# Patient Record
Sex: Female | Born: 1940 | Race: White | Hispanic: No | Smoking: Former smoker
Health system: Southern US, Community
[De-identification: ages and names within clinical notes are randomized; demographics above are authoritative.]

## PROBLEM LIST (undated history)

## (undated) DIAGNOSIS — M797 Fibromyalgia: Secondary | ICD-10-CM

## (undated) DIAGNOSIS — F32A Depression, unspecified: Secondary | ICD-10-CM

## (undated) DIAGNOSIS — M109 Gout, unspecified: Secondary | ICD-10-CM

## (undated) DIAGNOSIS — F419 Anxiety disorder, unspecified: Secondary | ICD-10-CM

## (undated) DIAGNOSIS — C3490 Malignant neoplasm of unspecified part of unspecified bronchus or lung: Secondary | ICD-10-CM

## (undated) DIAGNOSIS — D649 Anemia, unspecified: Secondary | ICD-10-CM

## (undated) DIAGNOSIS — J45909 Unspecified asthma, uncomplicated: Secondary | ICD-10-CM

## (undated) DIAGNOSIS — M1990 Unspecified osteoarthritis, unspecified site: Secondary | ICD-10-CM

## (undated) DIAGNOSIS — K219 Gastro-esophageal reflux disease without esophagitis: Secondary | ICD-10-CM

## (undated) DIAGNOSIS — J449 Chronic obstructive pulmonary disease, unspecified: Secondary | ICD-10-CM

## (undated) HISTORY — DX: Fibromyalgia: M79.7

## (undated) HISTORY — PX: FOOT SURGERY: SHX648

## (undated) HISTORY — DX: Malignant neoplasm of unspecified part of unspecified bronchus or lung: C34.90

## (undated) HISTORY — DX: Anxiety disorder, unspecified: F41.9

## (undated) HISTORY — DX: Gastro-esophageal reflux disease without esophagitis: K21.9

## (undated) HISTORY — PX: OTHER SURGICAL HISTORY: SHX169

## (undated) HISTORY — DX: Gout, unspecified: M10.9

## (undated) HISTORY — DX: Anemia, unspecified: D64.9

## (undated) HISTORY — PX: CATARACT EXTRACTION: SUR2

## (undated) HISTORY — DX: Unspecified osteoarthritis, unspecified site: M19.90

## (undated) HISTORY — DX: Depression, unspecified: F32.A

## (undated) HISTORY — DX: Unspecified asthma, uncomplicated: J45.909

## (undated) HISTORY — DX: Chronic obstructive pulmonary disease, unspecified: J44.9

---

## 2004-10-01 ENCOUNTER — Encounter (INDEPENDENT_AMBULATORY_CARE_PROVIDER_SITE_OTHER): Payer: Self-pay | Admitting: *Deleted

## 2004-10-01 LAB — CONVERTED CEMR LAB

## 2004-10-14 ENCOUNTER — Ambulatory Visit: Payer: Self-pay | Admitting: Family Medicine

## 2004-10-22 ENCOUNTER — Ambulatory Visit (HOSPITAL_COMMUNITY): Admission: RE | Admit: 2004-10-22 | Discharge: 2004-10-22 | Payer: Self-pay | Admitting: Family Medicine

## 2004-10-29 ENCOUNTER — Ambulatory Visit: Payer: Self-pay | Admitting: Family Medicine

## 2004-11-11 ENCOUNTER — Ambulatory Visit: Payer: Self-pay | Admitting: Family Medicine

## 2004-11-23 ENCOUNTER — Ambulatory Visit: Payer: Self-pay | Admitting: Family Medicine

## 2004-12-01 ENCOUNTER — Ambulatory Visit (HOSPITAL_COMMUNITY): Admission: RE | Admit: 2004-12-01 | Discharge: 2004-12-01 | Payer: Self-pay | Admitting: *Deleted

## 2004-12-09 ENCOUNTER — Encounter: Admission: RE | Admit: 2004-12-09 | Discharge: 2004-12-09 | Payer: Self-pay | Admitting: Sports Medicine

## 2004-12-13 ENCOUNTER — Ambulatory Visit: Payer: Self-pay | Admitting: Sports Medicine

## 2004-12-21 ENCOUNTER — Encounter: Admission: RE | Admit: 2004-12-21 | Discharge: 2004-12-21 | Payer: Self-pay | Admitting: Sports Medicine

## 2004-12-21 ENCOUNTER — Ambulatory Visit: Payer: Self-pay | Admitting: Family Medicine

## 2004-12-22 ENCOUNTER — Ambulatory Visit: Payer: Self-pay | Admitting: Family Medicine

## 2004-12-29 ENCOUNTER — Ambulatory Visit: Payer: Self-pay | Admitting: Family Medicine

## 2004-12-30 ENCOUNTER — Ambulatory Visit (HOSPITAL_COMMUNITY): Admission: RE | Admit: 2004-12-30 | Discharge: 2004-12-30 | Payer: Self-pay | Admitting: Sports Medicine

## 2005-01-17 ENCOUNTER — Ambulatory Visit: Payer: Self-pay | Admitting: Sports Medicine

## 2005-01-28 ENCOUNTER — Ambulatory Visit: Payer: Self-pay | Admitting: Sports Medicine

## 2005-02-01 ENCOUNTER — Ambulatory Visit: Payer: Self-pay | Admitting: Family Medicine

## 2005-02-17 ENCOUNTER — Ambulatory Visit (HOSPITAL_COMMUNITY): Admission: RE | Admit: 2005-02-17 | Discharge: 2005-02-17 | Payer: Self-pay | Admitting: Family Medicine

## 2005-02-22 ENCOUNTER — Ambulatory Visit: Payer: Self-pay | Admitting: Family Medicine

## 2005-04-12 ENCOUNTER — Ambulatory Visit: Payer: Self-pay | Admitting: Family Medicine

## 2005-09-02 ENCOUNTER — Ambulatory Visit: Payer: Self-pay | Admitting: Family Medicine

## 2006-01-08 IMAGING — CR DG CERVICAL SPINE COMPLETE 4+V
7 series · 7 of 7 positions shown · non-contrast
Comparison: none

CLINICAL DATA: Pain. 
 CERVICAL SPINE ? 5 VIEWS:
 Five views of the cervical spine were obtained.  There is degenerative disc disease particularly at C5-6 with loss of disc space and spur formation.  On oblique views, there is disc foraminal narrowing of a moderate degree bilaterally at C5-6 with the remainder of the foramina being patent. The odontoid process is intact.

[w c-spine lat]
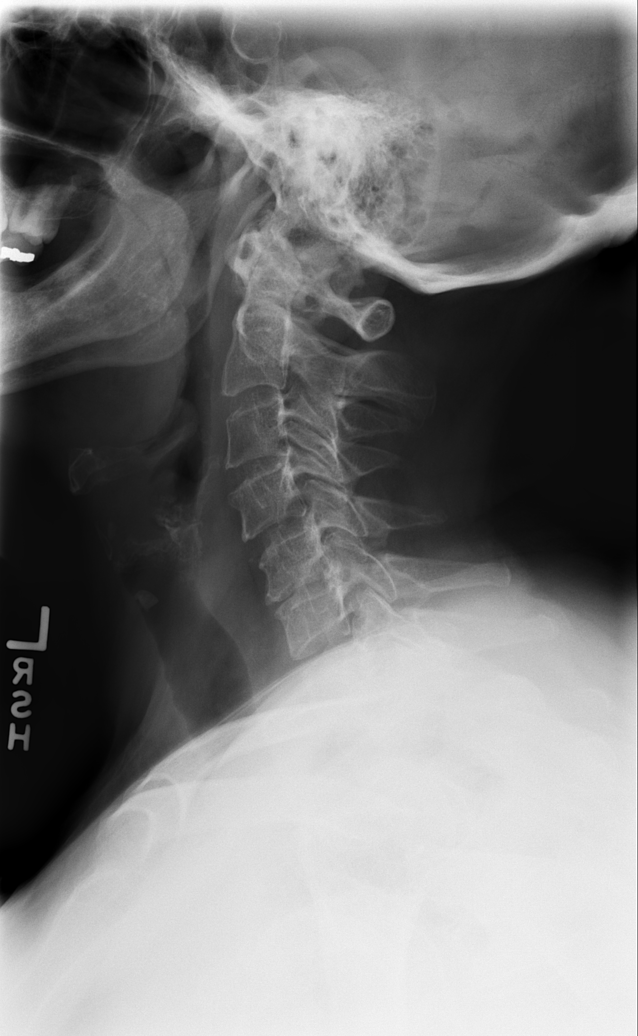

[w c-spine oblique (1 of 2)]
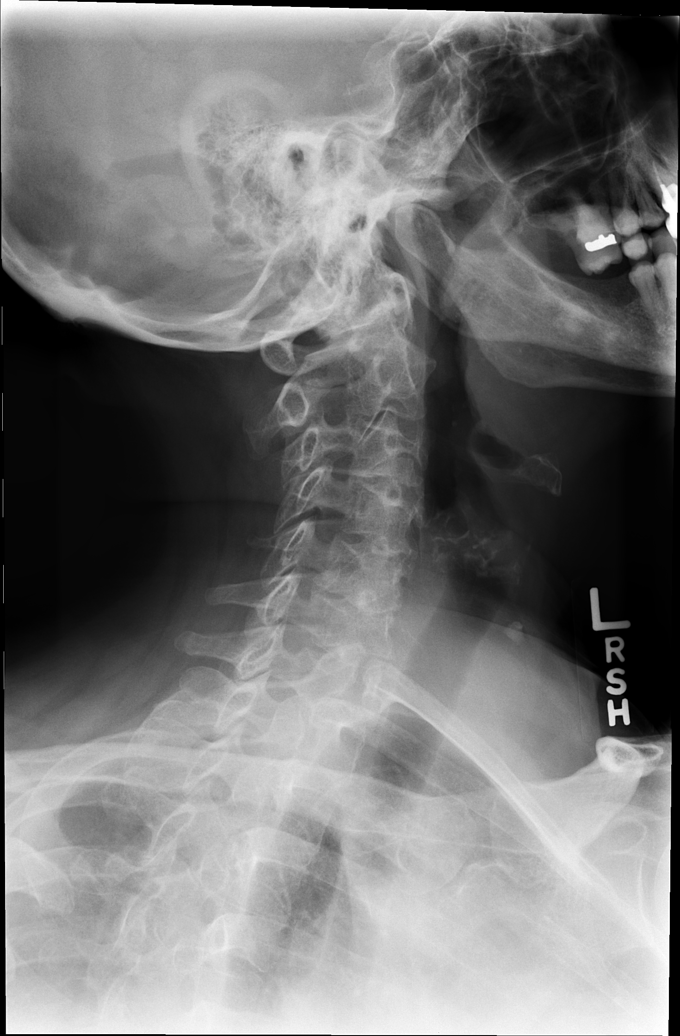

[w c-spine oblique (2 of 2)]
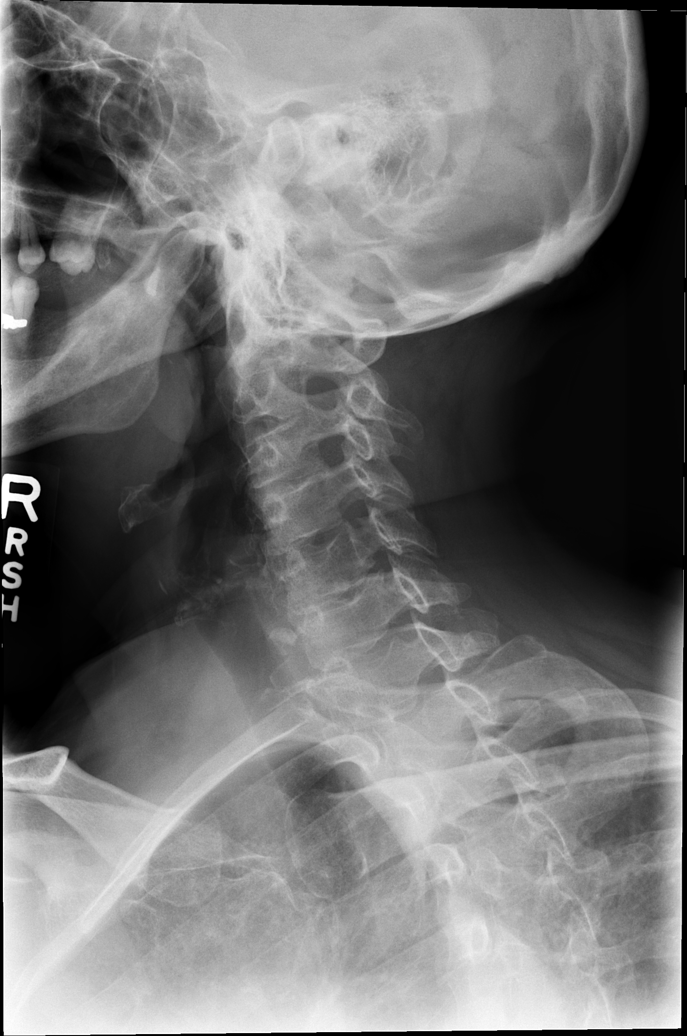

[w c-spine a.p. *]
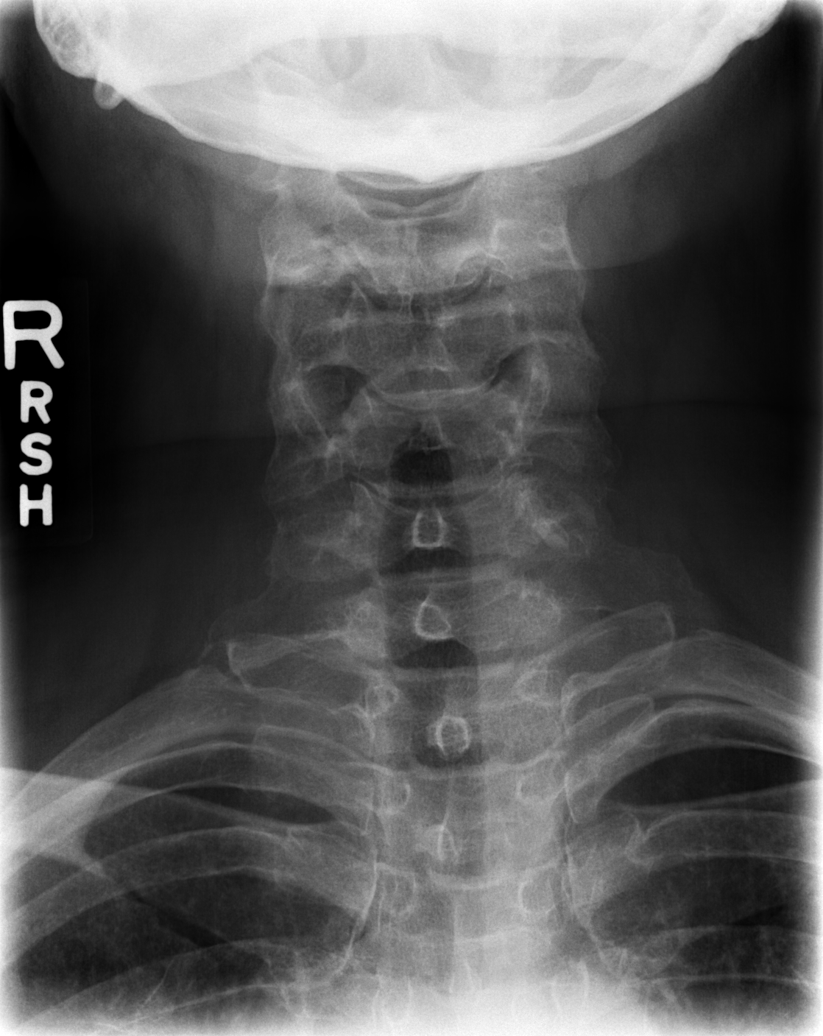

[w c-spine odontoid * (1 of 2)]
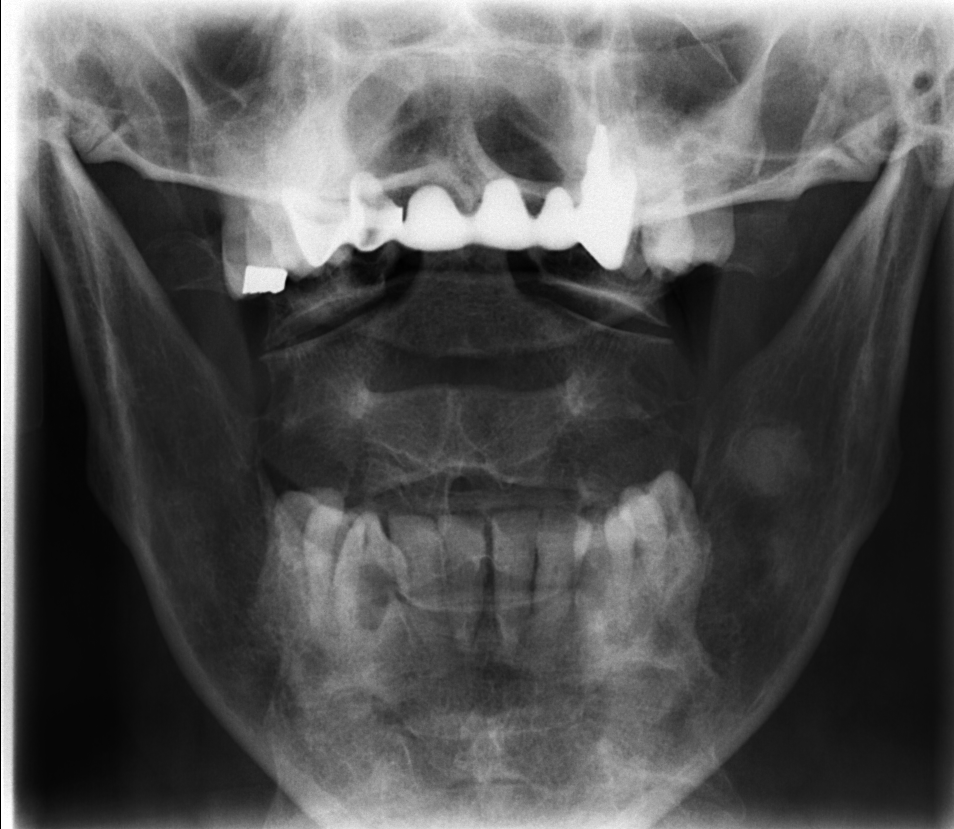

[w c-spine odontoid * (2 of 2)]
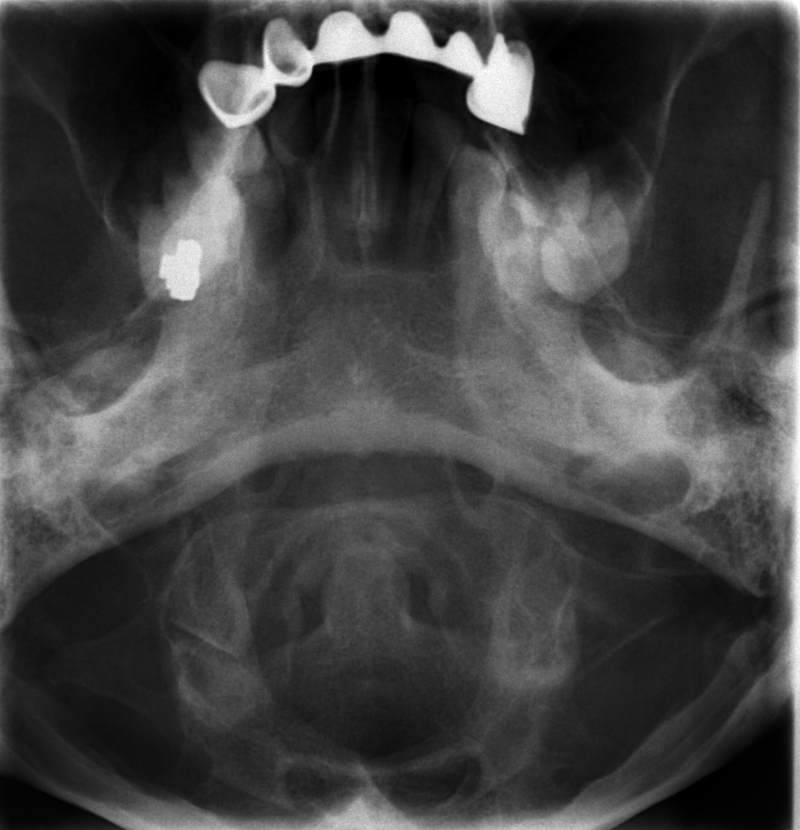

[w swimmers view *]
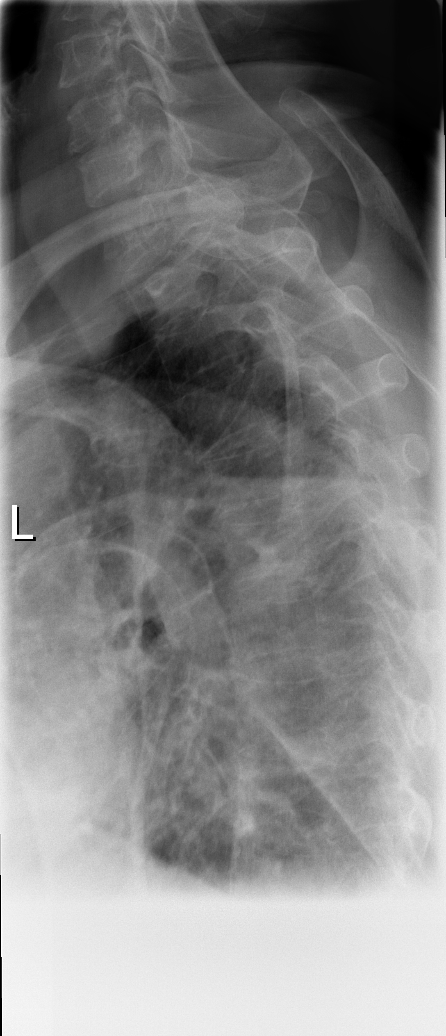

[7 of 7 positions shown; findings below may reference images not displayed]

IMPRESSION: Degenerative disc disease at C5-6 with bilateral foraminal narrowing at that level.  Normal alignment.

## 2006-01-08 IMAGING — CR DG CERVICAL SPINE FLEX&EXT ONLY
2 series · 2 of 2 positions shown · non-contrast
Comparison: none

CLINICAL DATA: Neck and left arm pain, left shoulder pain.   Reported history of arthritis.  No reported injury.  
CERVICAL SPINE FLEXION AND EXTENSION:
Satisfactory range of motion with extension but some decrease in ROM with flexion.  Straightening of the cervical spine mainly the lower aspect. Anterior subluxation, C4 on C5 by 2.5-3.0 mm.    Minimal anterior subluxation of C4 on C5 was also appreciated on the complete C-spine series earlier today.  The minimal subluxation at C5-6 is likely  associated with spondylosis in the absence of trauma.  egenerative changes involving the facet and uncovertebral joints.

[w c-spine extension]
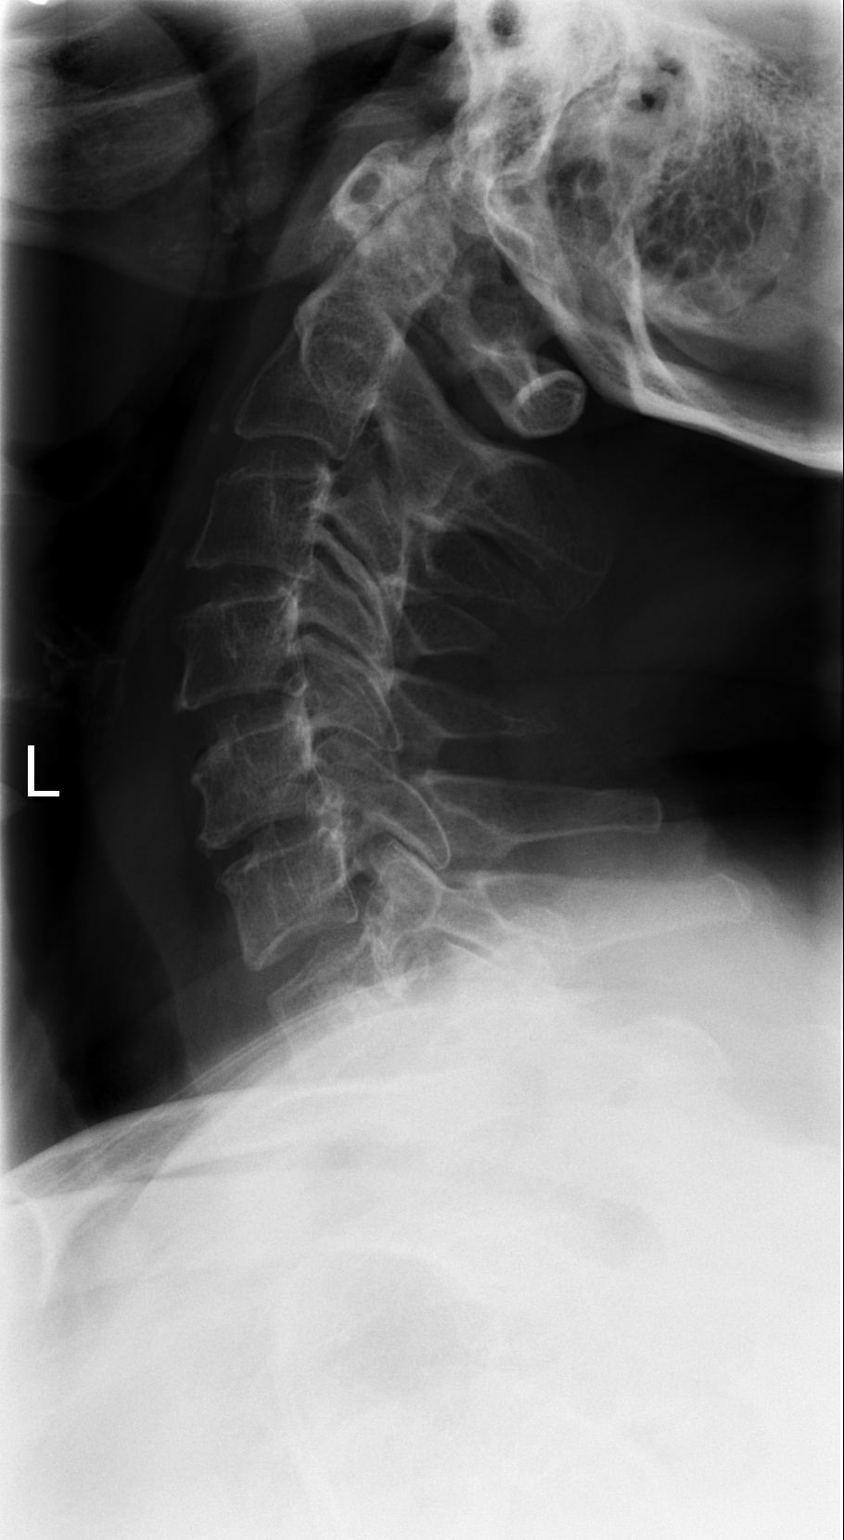

[w c-spine flexion *]
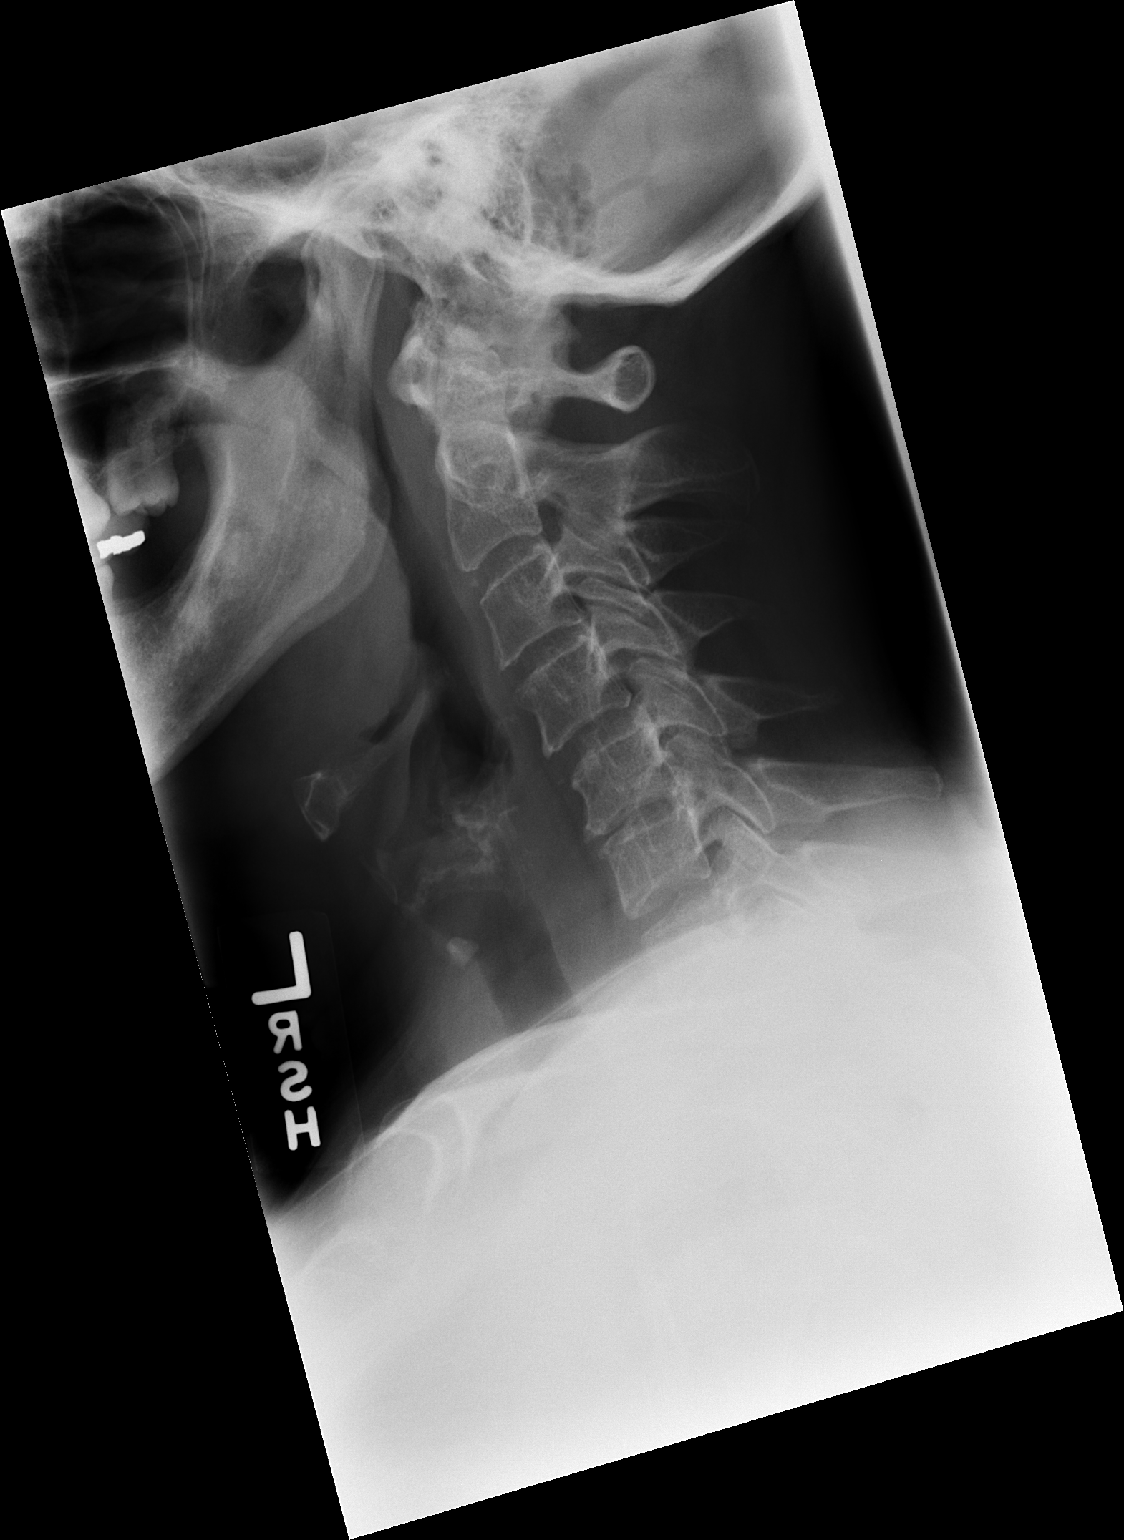

[2 of 2 positions shown; findings below may reference images not displayed]

IMPRESSION: Mild anterior subluxation of C4 on C5 which accentuates minimally with flexion.  Reportedly no history of trauma. The findings are likely degenerative in nature, possibly laxity of ligamentous structures.

## 2006-11-23 DIAGNOSIS — G4700 Insomnia, unspecified: Secondary | ICD-10-CM | POA: Insufficient documentation

## 2006-11-23 DIAGNOSIS — M129 Arthropathy, unspecified: Secondary | ICD-10-CM | POA: Insufficient documentation

## 2006-11-23 DIAGNOSIS — F329 Major depressive disorder, single episode, unspecified: Secondary | ICD-10-CM | POA: Insufficient documentation

## 2006-11-23 DIAGNOSIS — J45909 Unspecified asthma, uncomplicated: Secondary | ICD-10-CM | POA: Insufficient documentation

## 2006-11-23 DIAGNOSIS — F411 Generalized anxiety disorder: Secondary | ICD-10-CM | POA: Insufficient documentation

## 2006-11-23 DIAGNOSIS — I10 Essential (primary) hypertension: Secondary | ICD-10-CM | POA: Insufficient documentation

## 2006-11-23 DIAGNOSIS — E669 Obesity, unspecified: Secondary | ICD-10-CM | POA: Insufficient documentation

## 2006-11-23 DIAGNOSIS — F17200 Nicotine dependence, unspecified, uncomplicated: Secondary | ICD-10-CM | POA: Insufficient documentation

## 2006-11-23 DIAGNOSIS — H269 Unspecified cataract: Secondary | ICD-10-CM | POA: Insufficient documentation

## 2006-11-23 DIAGNOSIS — I871 Compression of vein: Secondary | ICD-10-CM | POA: Insufficient documentation

## 2006-11-24 ENCOUNTER — Encounter (INDEPENDENT_AMBULATORY_CARE_PROVIDER_SITE_OTHER): Payer: Self-pay | Admitting: *Deleted

## 2018-07-18 DIAGNOSIS — M549 Dorsalgia, unspecified: Secondary | ICD-10-CM | POA: Diagnosis not present

## 2018-07-18 DIAGNOSIS — Z720 Tobacco use: Secondary | ICD-10-CM

## 2018-07-18 DIAGNOSIS — K219 Gastro-esophageal reflux disease without esophagitis: Secondary | ICD-10-CM | POA: Diagnosis not present

## 2018-07-18 DIAGNOSIS — J9621 Acute and chronic respiratory failure with hypoxia: Secondary | ICD-10-CM | POA: Diagnosis not present

## 2018-07-18 DIAGNOSIS — J441 Chronic obstructive pulmonary disease with (acute) exacerbation: Secondary | ICD-10-CM | POA: Diagnosis not present

## 2018-07-18 DIAGNOSIS — J9620 Acute and chronic respiratory failure, unspecified whether with hypoxia or hypercapnia: Secondary | ICD-10-CM

## 2018-07-19 DIAGNOSIS — J9621 Acute and chronic respiratory failure with hypoxia: Secondary | ICD-10-CM | POA: Diagnosis not present

## 2018-07-19 DIAGNOSIS — J441 Chronic obstructive pulmonary disease with (acute) exacerbation: Secondary | ICD-10-CM | POA: Diagnosis not present

## 2018-07-19 DIAGNOSIS — F419 Anxiety disorder, unspecified: Secondary | ICD-10-CM | POA: Diagnosis not present

## 2018-07-19 DIAGNOSIS — I10 Essential (primary) hypertension: Secondary | ICD-10-CM | POA: Diagnosis not present

## 2018-07-20 DIAGNOSIS — J9621 Acute and chronic respiratory failure with hypoxia: Secondary | ICD-10-CM | POA: Diagnosis not present

## 2018-07-20 DIAGNOSIS — I10 Essential (primary) hypertension: Secondary | ICD-10-CM | POA: Diagnosis not present

## 2018-07-20 DIAGNOSIS — J441 Chronic obstructive pulmonary disease with (acute) exacerbation: Secondary | ICD-10-CM | POA: Diagnosis not present

## 2018-07-20 DIAGNOSIS — F419 Anxiety disorder, unspecified: Secondary | ICD-10-CM | POA: Diagnosis not present

## 2018-07-21 DIAGNOSIS — Z9114 Patient's other noncompliance with medication regimen: Secondary | ICD-10-CM

## 2018-07-21 DIAGNOSIS — M549 Dorsalgia, unspecified: Secondary | ICD-10-CM | POA: Diagnosis not present

## 2018-07-21 DIAGNOSIS — R5381 Other malaise: Secondary | ICD-10-CM

## 2018-07-21 DIAGNOSIS — G4733 Obstructive sleep apnea (adult) (pediatric): Secondary | ICD-10-CM

## 2018-07-21 DIAGNOSIS — J441 Chronic obstructive pulmonary disease with (acute) exacerbation: Secondary | ICD-10-CM | POA: Diagnosis not present

## 2018-07-21 DIAGNOSIS — K219 Gastro-esophageal reflux disease without esophagitis: Secondary | ICD-10-CM | POA: Diagnosis not present

## 2018-07-21 DIAGNOSIS — J9621 Acute and chronic respiratory failure with hypoxia: Secondary | ICD-10-CM | POA: Diagnosis not present

## 2019-06-15 DIAGNOSIS — E872 Acidosis: Secondary | ICD-10-CM

## 2019-06-15 DIAGNOSIS — J189 Pneumonia, unspecified organism: Secondary | ICD-10-CM | POA: Diagnosis not present

## 2019-06-15 DIAGNOSIS — I361 Nonrheumatic tricuspid (valve) insufficiency: Secondary | ICD-10-CM | POA: Diagnosis not present

## 2019-06-15 DIAGNOSIS — J441 Chronic obstructive pulmonary disease with (acute) exacerbation: Secondary | ICD-10-CM

## 2019-06-15 DIAGNOSIS — I5032 Chronic diastolic (congestive) heart failure: Secondary | ICD-10-CM | POA: Diagnosis not present

## 2019-06-15 DIAGNOSIS — I272 Pulmonary hypertension, unspecified: Secondary | ICD-10-CM

## 2019-06-15 DIAGNOSIS — E871 Hypo-osmolality and hyponatremia: Secondary | ICD-10-CM

## 2019-06-15 DIAGNOSIS — I130 Hypertensive heart and chronic kidney disease with heart failure and stage 1 through stage 4 chronic kidney disease, or unspecified chronic kidney disease: Secondary | ICD-10-CM | POA: Diagnosis not present

## 2019-06-15 DIAGNOSIS — J440 Chronic obstructive pulmonary disease with acute lower respiratory infection: Secondary | ICD-10-CM | POA: Diagnosis not present

## 2019-06-16 DIAGNOSIS — I130 Hypertensive heart and chronic kidney disease with heart failure and stage 1 through stage 4 chronic kidney disease, or unspecified chronic kidney disease: Secondary | ICD-10-CM | POA: Diagnosis not present

## 2019-06-16 DIAGNOSIS — J189 Pneumonia, unspecified organism: Secondary | ICD-10-CM | POA: Diagnosis not present

## 2019-06-16 DIAGNOSIS — I5032 Chronic diastolic (congestive) heart failure: Secondary | ICD-10-CM | POA: Diagnosis not present

## 2019-06-16 DIAGNOSIS — J440 Chronic obstructive pulmonary disease with acute lower respiratory infection: Secondary | ICD-10-CM | POA: Diagnosis not present

## 2019-06-17 DIAGNOSIS — J189 Pneumonia, unspecified organism: Secondary | ICD-10-CM | POA: Diagnosis not present

## 2019-06-17 DIAGNOSIS — I130 Hypertensive heart and chronic kidney disease with heart failure and stage 1 through stage 4 chronic kidney disease, or unspecified chronic kidney disease: Secondary | ICD-10-CM | POA: Diagnosis not present

## 2019-06-17 DIAGNOSIS — J440 Chronic obstructive pulmonary disease with acute lower respiratory infection: Secondary | ICD-10-CM | POA: Diagnosis not present

## 2019-06-17 DIAGNOSIS — I5032 Chronic diastolic (congestive) heart failure: Secondary | ICD-10-CM | POA: Diagnosis not present

## 2019-06-18 DIAGNOSIS — I130 Hypertensive heart and chronic kidney disease with heart failure and stage 1 through stage 4 chronic kidney disease, or unspecified chronic kidney disease: Secondary | ICD-10-CM | POA: Diagnosis not present

## 2019-06-18 DIAGNOSIS — I5032 Chronic diastolic (congestive) heart failure: Secondary | ICD-10-CM | POA: Diagnosis not present

## 2019-06-18 DIAGNOSIS — J440 Chronic obstructive pulmonary disease with acute lower respiratory infection: Secondary | ICD-10-CM | POA: Diagnosis not present

## 2019-06-18 DIAGNOSIS — J189 Pneumonia, unspecified organism: Secondary | ICD-10-CM | POA: Diagnosis not present

## 2019-06-19 DIAGNOSIS — I5032 Chronic diastolic (congestive) heart failure: Secondary | ICD-10-CM | POA: Diagnosis not present

## 2019-06-19 DIAGNOSIS — J189 Pneumonia, unspecified organism: Secondary | ICD-10-CM | POA: Diagnosis not present

## 2019-06-19 DIAGNOSIS — J440 Chronic obstructive pulmonary disease with acute lower respiratory infection: Secondary | ICD-10-CM | POA: Diagnosis not present

## 2019-06-19 DIAGNOSIS — I130 Hypertensive heart and chronic kidney disease with heart failure and stage 1 through stage 4 chronic kidney disease, or unspecified chronic kidney disease: Secondary | ICD-10-CM | POA: Diagnosis not present

## 2019-11-05 DIAGNOSIS — N179 Acute kidney failure, unspecified: Secondary | ICD-10-CM | POA: Diagnosis not present

## 2019-11-05 DIAGNOSIS — L03115 Cellulitis of right lower limb: Secondary | ICD-10-CM | POA: Diagnosis not present

## 2019-11-05 DIAGNOSIS — R52 Pain, unspecified: Secondary | ICD-10-CM | POA: Diagnosis not present

## 2019-11-05 DIAGNOSIS — S82121A Displaced fracture of lateral condyle of right tibia, initial encounter for closed fracture: Secondary | ICD-10-CM | POA: Diagnosis not present

## 2019-11-05 DIAGNOSIS — J449 Chronic obstructive pulmonary disease, unspecified: Secondary | ICD-10-CM

## 2019-11-06 DIAGNOSIS — S82121A Displaced fracture of lateral condyle of right tibia, initial encounter for closed fracture: Secondary | ICD-10-CM | POA: Diagnosis not present

## 2019-11-06 DIAGNOSIS — R52 Pain, unspecified: Secondary | ICD-10-CM | POA: Diagnosis not present

## 2019-11-06 DIAGNOSIS — N179 Acute kidney failure, unspecified: Secondary | ICD-10-CM | POA: Diagnosis not present

## 2019-11-06 DIAGNOSIS — L03115 Cellulitis of right lower limb: Secondary | ICD-10-CM | POA: Diagnosis not present

## 2019-11-07 DIAGNOSIS — S82121A Displaced fracture of lateral condyle of right tibia, initial encounter for closed fracture: Secondary | ICD-10-CM | POA: Diagnosis not present

## 2019-11-07 DIAGNOSIS — R52 Pain, unspecified: Secondary | ICD-10-CM | POA: Diagnosis not present

## 2019-11-07 DIAGNOSIS — N179 Acute kidney failure, unspecified: Secondary | ICD-10-CM | POA: Diagnosis not present

## 2019-11-07 DIAGNOSIS — L03115 Cellulitis of right lower limb: Secondary | ICD-10-CM | POA: Diagnosis not present

## 2019-11-08 DIAGNOSIS — S82121A Displaced fracture of lateral condyle of right tibia, initial encounter for closed fracture: Secondary | ICD-10-CM | POA: Diagnosis not present

## 2019-11-08 DIAGNOSIS — L03115 Cellulitis of right lower limb: Secondary | ICD-10-CM | POA: Diagnosis not present

## 2019-11-08 DIAGNOSIS — N179 Acute kidney failure, unspecified: Secondary | ICD-10-CM | POA: Diagnosis not present

## 2019-11-08 DIAGNOSIS — R52 Pain, unspecified: Secondary | ICD-10-CM | POA: Diagnosis not present

## 2020-01-10 DIAGNOSIS — J449 Chronic obstructive pulmonary disease, unspecified: Secondary | ICD-10-CM | POA: Diagnosis not present

## 2020-01-10 DIAGNOSIS — J189 Pneumonia, unspecified organism: Secondary | ICD-10-CM | POA: Diagnosis not present

## 2020-01-10 DIAGNOSIS — R410 Disorientation, unspecified: Secondary | ICD-10-CM | POA: Diagnosis not present

## 2020-01-10 DIAGNOSIS — R197 Diarrhea, unspecified: Secondary | ICD-10-CM | POA: Diagnosis not present

## 2020-01-11 DIAGNOSIS — J449 Chronic obstructive pulmonary disease, unspecified: Secondary | ICD-10-CM | POA: Diagnosis not present

## 2020-01-11 DIAGNOSIS — R410 Disorientation, unspecified: Secondary | ICD-10-CM | POA: Diagnosis not present

## 2020-01-11 DIAGNOSIS — R197 Diarrhea, unspecified: Secondary | ICD-10-CM | POA: Diagnosis not present

## 2020-01-11 DIAGNOSIS — J189 Pneumonia, unspecified organism: Secondary | ICD-10-CM | POA: Diagnosis not present

## 2020-01-12 DIAGNOSIS — R197 Diarrhea, unspecified: Secondary | ICD-10-CM | POA: Diagnosis not present

## 2020-01-12 DIAGNOSIS — R410 Disorientation, unspecified: Secondary | ICD-10-CM | POA: Diagnosis not present

## 2020-01-12 DIAGNOSIS — J189 Pneumonia, unspecified organism: Secondary | ICD-10-CM | POA: Diagnosis not present

## 2020-01-12 DIAGNOSIS — J449 Chronic obstructive pulmonary disease, unspecified: Secondary | ICD-10-CM | POA: Diagnosis not present

## 2020-01-13 DIAGNOSIS — J449 Chronic obstructive pulmonary disease, unspecified: Secondary | ICD-10-CM | POA: Diagnosis not present

## 2020-01-13 DIAGNOSIS — J189 Pneumonia, unspecified organism: Secondary | ICD-10-CM | POA: Diagnosis not present

## 2020-01-13 DIAGNOSIS — R197 Diarrhea, unspecified: Secondary | ICD-10-CM | POA: Diagnosis not present

## 2020-01-13 DIAGNOSIS — R410 Disorientation, unspecified: Secondary | ICD-10-CM | POA: Diagnosis not present

## 2020-01-14 DIAGNOSIS — R410 Disorientation, unspecified: Secondary | ICD-10-CM | POA: Diagnosis not present

## 2020-01-14 DIAGNOSIS — J449 Chronic obstructive pulmonary disease, unspecified: Secondary | ICD-10-CM | POA: Diagnosis not present

## 2020-01-14 DIAGNOSIS — J189 Pneumonia, unspecified organism: Secondary | ICD-10-CM | POA: Diagnosis not present

## 2020-01-14 DIAGNOSIS — R197 Diarrhea, unspecified: Secondary | ICD-10-CM | POA: Diagnosis not present

## 2020-01-15 DIAGNOSIS — R410 Disorientation, unspecified: Secondary | ICD-10-CM | POA: Diagnosis not present

## 2020-01-15 DIAGNOSIS — R197 Diarrhea, unspecified: Secondary | ICD-10-CM | POA: Diagnosis not present

## 2020-01-15 DIAGNOSIS — J189 Pneumonia, unspecified organism: Secondary | ICD-10-CM | POA: Diagnosis not present

## 2020-01-15 DIAGNOSIS — J449 Chronic obstructive pulmonary disease, unspecified: Secondary | ICD-10-CM | POA: Diagnosis not present

## 2020-01-16 DIAGNOSIS — R410 Disorientation, unspecified: Secondary | ICD-10-CM | POA: Diagnosis not present

## 2020-01-16 DIAGNOSIS — J189 Pneumonia, unspecified organism: Secondary | ICD-10-CM | POA: Diagnosis not present

## 2020-01-16 DIAGNOSIS — J449 Chronic obstructive pulmonary disease, unspecified: Secondary | ICD-10-CM | POA: Diagnosis not present

## 2020-01-16 DIAGNOSIS — R197 Diarrhea, unspecified: Secondary | ICD-10-CM | POA: Diagnosis not present

## 2020-01-17 DIAGNOSIS — J189 Pneumonia, unspecified organism: Secondary | ICD-10-CM | POA: Diagnosis not present

## 2020-01-17 DIAGNOSIS — J449 Chronic obstructive pulmonary disease, unspecified: Secondary | ICD-10-CM | POA: Diagnosis not present

## 2020-01-17 DIAGNOSIS — R197 Diarrhea, unspecified: Secondary | ICD-10-CM | POA: Diagnosis not present

## 2020-01-17 DIAGNOSIS — R410 Disorientation, unspecified: Secondary | ICD-10-CM | POA: Diagnosis not present

## 2020-01-18 DIAGNOSIS — J189 Pneumonia, unspecified organism: Secondary | ICD-10-CM | POA: Diagnosis not present

## 2020-01-18 DIAGNOSIS — J449 Chronic obstructive pulmonary disease, unspecified: Secondary | ICD-10-CM | POA: Diagnosis not present

## 2020-01-18 DIAGNOSIS — R410 Disorientation, unspecified: Secondary | ICD-10-CM | POA: Diagnosis not present

## 2020-01-18 DIAGNOSIS — R197 Diarrhea, unspecified: Secondary | ICD-10-CM | POA: Diagnosis not present

## 2020-01-19 DIAGNOSIS — J189 Pneumonia, unspecified organism: Secondary | ICD-10-CM | POA: Diagnosis not present

## 2020-01-19 DIAGNOSIS — R410 Disorientation, unspecified: Secondary | ICD-10-CM | POA: Diagnosis not present

## 2020-01-19 DIAGNOSIS — R197 Diarrhea, unspecified: Secondary | ICD-10-CM | POA: Diagnosis not present

## 2020-01-19 DIAGNOSIS — J449 Chronic obstructive pulmonary disease, unspecified: Secondary | ICD-10-CM | POA: Diagnosis not present

## 2020-01-20 DIAGNOSIS — J189 Pneumonia, unspecified organism: Secondary | ICD-10-CM | POA: Diagnosis not present

## 2020-01-20 DIAGNOSIS — R410 Disorientation, unspecified: Secondary | ICD-10-CM | POA: Diagnosis not present

## 2020-01-20 DIAGNOSIS — R197 Diarrhea, unspecified: Secondary | ICD-10-CM | POA: Diagnosis not present

## 2020-01-20 DIAGNOSIS — J449 Chronic obstructive pulmonary disease, unspecified: Secondary | ICD-10-CM | POA: Diagnosis not present

## 2020-01-21 DIAGNOSIS — J189 Pneumonia, unspecified organism: Secondary | ICD-10-CM | POA: Diagnosis not present

## 2020-01-21 DIAGNOSIS — R197 Diarrhea, unspecified: Secondary | ICD-10-CM | POA: Diagnosis not present

## 2020-01-21 DIAGNOSIS — J449 Chronic obstructive pulmonary disease, unspecified: Secondary | ICD-10-CM | POA: Diagnosis not present

## 2020-01-21 DIAGNOSIS — R410 Disorientation, unspecified: Secondary | ICD-10-CM | POA: Diagnosis not present

## 2021-02-17 ENCOUNTER — Telehealth: Payer: Self-pay | Admitting: Oncology

## 2021-02-17 NOTE — Progress Notes (Signed)
[facility]  [address] [place],  [place]  [address] [phone]  Clinic Day:  02/18/2021  Referring physician: Gardiner Rhyme, MD   HISTORY OF PRESENT ILLNESS:  The patient is a 80 y.o. female who I was asked to consult upon for newly diagnosed small cell lung cancer.  Her history dates back to earlier this month when she presented to the emergency room with involuntary jerking motions and an altered mental status.  During her workup, a brain MRI was done, which was negative for CNS metastasis.   A chest CT was also done, which unexpected showed a large mediastinal mass, with the largest dimension measuring 7.3 cm.  This was unexpected as a chest CT done in April 2022 showed no such finding.  This scan also showed a right supraclavicular lymph node that appeared to be pathologic in nature.  This led to a supraclavicular lymph node biopsy being done, which revealed small cell lung cancer.  She comes in today to go over her pathology and its implications.  She denies having hemoptysis, but has had an intermittent cough.  Her oxygen requirements have increased recently to where she is now on 3L of O2.  Of note, this patient smoked as much as 2-4 packs of cigarettes daily for 60 years before quitting last year.    PAST MEDICAL HISTORY:   Past Medical History:  Diagnosis Date  . Anemia   . Anxiety   . Arthritis   . Asthma   . COPD (chronic obstructive pulmonary disease) ([place])   . Depression   . Fibromyalgia   . GERD (gastroesophageal reflux disease)   . Gout   . Lung cancer ([place])     PAST SURGICAL HISTORY:  Bilateral tubal ligation Left foot surgery  CURRENT MEDICATIONS:   Current Outpatient Medications  Medication Sig Dispense Refill  . albuterol (VENTOLIN HFA) 108 (90 Base) MCG/ACT inhaler     . buPROPion (WELLBUTRIN SR) 150 MG 12 hr tablet Take 2 tablets by mouth daily.    . fluticasone (FLONASE) 50 MCG/ACT nasal spray     .  fluticasone-salmeterol (ADVAIR DISKUS) 500-50 MCG/ACT AEPB Take by mouth.    . furosemide (LASIX) 40 MG tablet Take by mouth.    . gabapentin (NEURONTIN) 600 MG tablet Take 1 tablet by mouth 3 (three) times daily.    Marland Kitchen ipratropium-albuterol (DUONEB) 0.5-2.5 (3) MG/3ML SOLN     . levofloxacin (LEVAQUIN) 500 MG tablet     . omeprazole (PRILOSEC) 40 MG capsule     . potassium chloride SA (KLOR-CON M20) 20 MEQ tablet     . predniSONE (STERAPRED UNI-PAK 21 TAB) 5 MG (21) TBPK tablet     . tiotropium (SPIRIVA HANDIHALER) 18 MCG inhalation capsule     . Vitamin D, Ergocalciferol, (DRISDOL) 1.25 MG (50000 UNIT) CAPS capsule     . aspirin 81 MG EC tablet     . budesonide-formoterol (SYMBICORT) 160-4.5 MCG/ACT inhaler     . dicyclomine (BENTYL) 20 MG tablet     . famotidine (PEPCID) 20 MG tablet     . ondansetron (ZOFRAN) 8 MG tablet     . oxyCODONE (ROXICODONE) 15 MG immediate release tablet Take 1 tablet (15 mg total) by mouth every 6 (six) hours as needed for pain. 40 tablet 0  . primidone (MYSOLINE) 50 MG tablet     . sertraline (ZOLOFT) 25 MG tablet     . spironolactone (ALDACTONE) 25 MG tablet     .  tamsulosin (FLOMAX) 0.4 MG CAPS capsule      No current facility-administered medications for this visit.    ALLERGIES:   Allergies  Allergen Reactions  . Ceclor [Cefaclor]     FAMILY HISTORY:   Family History  Problem Relation Age of Onset  . Breast cancer Mother   . Colon cancer Father   . Brain cancer Sister   . Heart disease Brother   . Lung cancer Sister   . Lupus Sister   1 sister died from primary brain cancer. Another sister had breast cancer.  One of her daughters has breast cancer.  Her son has a history of squamous cell lung cancer and is currently fighting cholangiocarcinoma.   SOCIAL HISTORY:  The patient was born and raised in [place].  She lives in town with her children.  She is widowed; she was previously married for 52 years.  She had 4 children (2  deceased), 5 grandchildren and 1 great-grandchild.  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  She denies a history of alcohol abuse.    REVIEW OF SYSTEMS:  Review of Systems  Constitutional: Positive for fatigue. Negative for fever.  HENT:   Negative for hearing loss and sore throat.   Eyes: Negative for eye problems.  Respiratory: Positive for cough. Negative for chest tightness and hemoptysis.   Cardiovascular: Negative for chest pain and palpitations.  Gastrointestinal: Positive for diarrhea. Negative for abdominal distention, abdominal pain, blood in stool, constipation, nausea and vomiting.  Endocrine: Negative for hot flashes.  Genitourinary: Negative for difficulty urinating, dysuria, frequency, hematuria and nocturia.   Musculoskeletal: Positive for back pain. Negative for arthralgias, gait problem and myalgias.       Right-sided chest wall pain  Skin: Negative.  Negative for itching and rash.  Neurological: Positive for headaches. Negative for dizziness, extremity weakness, gait problem, light-headedness and numbness.  Hematological: Negative.   Psychiatric/Behavioral: Positive for depression. Negative for suicidal ideas. The patient is not nervous/anxious.      PHYSICAL EXAM:  Blood pressure (!) 149/81, pulse 85, temperature 98.8 F (37.1 C), resp. rate 16, height 5\' 5"  (1.651 m), weight 174 lb 3.2 oz (79 kg), SpO2 98 %. Wt Readings from Last 3 Encounters:  02/18/21 174 lb 3.2 oz (79 kg)   Body mass index is 28.99 kg/m. Performance status (ECOG): 2 - Symptomatic, <50% confined to bed Physical Exam Constitutional:      Appearance: Normal appearance. She is ill-appearing.     Comments: In a wheelchair; wearing oxygen per nasal canula  HENT:     Mouth/Throat:     Mouth: Mucous membranes are moist.     Pharynx: Oropharynx is clear. No oropharyngeal exudate or posterior oropharyngeal erythema.  Cardiovascular:     Rate and Rhythm: Normal rate and regular  rhythm.     Heart sounds: No murmur heard. No friction rub. No gallop.   Pulmonary:     Effort: Pulmonary effort is normal. No respiratory distress.     Breath sounds: Normal breath sounds. No wheezing, rhonchi or rales.     Comments: Mildly decreased breath sounds bilaterally Chest:     Chest wall: Tenderness present.  Breasts:     Right: No axillary adenopathy or supraclavicular adenopathy.     Left: No axillary adenopathy or supraclavicular adenopathy.    Abdominal:     General: Bowel sounds are normal. There is no distension.     Palpations: Abdomen is soft. There is no mass.  Tenderness: There is no abdominal tenderness.  Musculoskeletal:        General: No swelling.     Right lower leg: No edema.     Left lower leg: No edema.  Lymphadenopathy:     Cervical: No cervical adenopathy.     Upper Body:     Right upper body: No supraclavicular or axillary adenopathy.     Left upper body: No supraclavicular or axillary adenopathy.     Lower Body: No right inguinal adenopathy. No left inguinal adenopathy.  Skin:    General: Skin is warm.     Coloration: Skin is not jaundiced.     Findings: No lesion or rash.  Neurological:     General: No focal deficit present.     Mental Status: She is alert and oriented to person, place, and time. Mental status is at baseline.     Cranial Nerves: Cranial nerves are intact.  Psychiatric:        Mood and Affect: Mood normal.        Behavior: Behavior normal.        Thought Content: Thought content normal.    LABS:   CBC Latest Ref Rng & Units 02/18/2021  WBC - 6.9  Hemoglobin 12.0 - 16.0 9.6(A)  Hematocrit 36 - 46 30(A)  Platelets 150 - 399 203   CMP Latest Ref Rng & Units 02/18/2021  BUN 4 - 21 25(A)  Creatinine 0.5 - 1.1 1.2(A)  Sodium 137 - 147 135(A)  Potassium 3.4 - 5.3 3.2(A)  Chloride 99 - 108 104  CO2 13 - 22 22  Calcium 8.7 - 10.7 8.5(A)  Alkaline Phos 25 - 125 92  AST 13 - 35 17  ALT 7 - 35 11   ASSESSMENT & PLAN:   A 80 y.o. female who I was asked to consult upon for newly diagnosed small cell lung cancer.  As her mediastinal mass has grown rapidly in just a short period of time, this shows how aggressive her malignancy is.  For staging purposes, I will have her undergo a bone scan and CT scans of her abdomen/pelvis.  If no evidence of metastatic disease is seen, I would employ chemoradiation to cure her of her disease.  If there is evidence of metastatic disease, chemotherapy/immunotherapy would be given for palliation/disease control.  These scans will be done tomorrow; I will see her after these scans are done to go over the results and their implications.  The patient understands all the plans discussed today and is in agreement with them.  I do appreciate Dr Gardiner Rhyme for his new consult.   Paw Karstens Macarthur Critchley, MD

## 2021-02-17 NOTE — Telephone Encounter (Signed)
Per Dr Bobby Rumpf, Inpatient Referral Request for Lung CA.  Appt made for 02/18/21 Consult 4:15 pm

## 2021-02-18 ENCOUNTER — Other Ambulatory Visit: Payer: Self-pay | Admitting: Oncology

## 2021-02-18 ENCOUNTER — Inpatient Hospital Stay: Payer: Medicare HMO

## 2021-02-18 ENCOUNTER — Encounter: Payer: Self-pay | Admitting: Oncology

## 2021-02-18 ENCOUNTER — Inpatient Hospital Stay: Payer: Medicare HMO | Attending: Oncology | Admitting: Oncology

## 2021-02-18 ENCOUNTER — Other Ambulatory Visit: Payer: Self-pay

## 2021-02-18 DIAGNOSIS — C3491 Malignant neoplasm of unspecified part of right bronchus or lung: Secondary | ICD-10-CM | POA: Diagnosis not present

## 2021-02-18 DIAGNOSIS — G250 Essential tremor: Secondary | ICD-10-CM | POA: Insufficient documentation

## 2021-02-18 DIAGNOSIS — C771 Secondary and unspecified malignant neoplasm of intrathoracic lymph nodes: Secondary | ICD-10-CM | POA: Diagnosis not present

## 2021-02-18 DIAGNOSIS — F411 Generalized anxiety disorder: Secondary | ICD-10-CM | POA: Insufficient documentation

## 2021-02-18 DIAGNOSIS — R5383 Other fatigue: Secondary | ICD-10-CM | POA: Diagnosis not present

## 2021-02-18 DIAGNOSIS — K219 Gastro-esophageal reflux disease without esophagitis: Secondary | ICD-10-CM | POA: Insufficient documentation

## 2021-02-18 DIAGNOSIS — M549 Dorsalgia, unspecified: Secondary | ICD-10-CM

## 2021-02-18 DIAGNOSIS — R269 Unspecified abnormalities of gait and mobility: Secondary | ICD-10-CM | POA: Insufficient documentation

## 2021-02-18 DIAGNOSIS — G894 Chronic pain syndrome: Secondary | ICD-10-CM | POA: Insufficient documentation

## 2021-02-18 DIAGNOSIS — Z79899 Other long term (current) drug therapy: Secondary | ICD-10-CM

## 2021-02-18 DIAGNOSIS — Z8249 Family history of ischemic heart disease and other diseases of the circulatory system: Secondary | ICD-10-CM

## 2021-02-18 DIAGNOSIS — R197 Diarrhea, unspecified: Secondary | ICD-10-CM

## 2021-02-18 DIAGNOSIS — C7931 Secondary malignant neoplasm of brain: Secondary | ICD-10-CM | POA: Diagnosis not present

## 2021-02-18 DIAGNOSIS — Z808 Family history of malignant neoplasm of other organs or systems: Secondary | ICD-10-CM

## 2021-02-18 DIAGNOSIS — Z801 Family history of malignant neoplasm of trachea, bronchus and lung: Secondary | ICD-10-CM

## 2021-02-18 DIAGNOSIS — Z832 Family history of diseases of the blood and blood-forming organs and certain disorders involving the immune mechanism: Secondary | ICD-10-CM

## 2021-02-18 DIAGNOSIS — I5032 Chronic diastolic (congestive) heart failure: Secondary | ICD-10-CM | POA: Insufficient documentation

## 2021-02-18 DIAGNOSIS — Z800 Family history of malignant neoplasm of digestive organs: Secondary | ICD-10-CM

## 2021-02-18 DIAGNOSIS — R519 Headache, unspecified: Secondary | ICD-10-CM

## 2021-02-18 DIAGNOSIS — J449 Chronic obstructive pulmonary disease, unspecified: Secondary | ICD-10-CM | POA: Insufficient documentation

## 2021-02-18 DIAGNOSIS — G609 Hereditary and idiopathic neuropathy, unspecified: Secondary | ICD-10-CM | POA: Insufficient documentation

## 2021-02-18 DIAGNOSIS — R059 Cough, unspecified: Secondary | ICD-10-CM

## 2021-02-18 DIAGNOSIS — Z803 Family history of malignant neoplasm of breast: Secondary | ICD-10-CM

## 2021-02-18 DIAGNOSIS — C3490 Malignant neoplasm of unspecified part of unspecified bronchus or lung: Secondary | ICD-10-CM

## 2021-02-18 LAB — BASIC METABOLIC PANEL
BUN: 25 — AB (ref 4–21)
CO2: 22 (ref 13–22)
Chloride: 104 (ref 99–108)
Creatinine: 1.2 — AB (ref 0.5–1.1)
Glucose: 144
Potassium: 3.2 — AB (ref 3.4–5.3)
Sodium: 135 — AB (ref 137–147)

## 2021-02-18 LAB — CBC AND DIFFERENTIAL
HCT: 30 — AB (ref 36–46)
Hemoglobin: 9.6 — AB (ref 12.0–16.0)
Neutrophils Absolute: 6.49
Platelets: 203 (ref 150–399)
WBC: 6.9

## 2021-02-18 LAB — HEPATIC FUNCTION PANEL
ALT: 11 (ref 7–35)
AST: 17 (ref 13–35)
Alkaline Phosphatase: 92 (ref 25–125)
Bilirubin, Total: 0.4

## 2021-02-18 LAB — COMPREHENSIVE METABOLIC PANEL
Albumin: 3.8 (ref 3.5–5.0)
Calcium: 8.5 — AB (ref 8.7–10.7)

## 2021-02-18 LAB — CBC: RBC: 3.34 — AB (ref 3.87–5.11)

## 2021-02-19 ENCOUNTER — Inpatient Hospital Stay (INDEPENDENT_AMBULATORY_CARE_PROVIDER_SITE_OTHER): Payer: Medicare HMO | Admitting: Oncology

## 2021-02-19 ENCOUNTER — Telehealth: Payer: Self-pay | Admitting: Oncology

## 2021-02-19 ENCOUNTER — Encounter: Payer: Self-pay | Admitting: Oncology

## 2021-02-19 VITALS — BP 177/84 | HR 73 | Temp 99.0°F | Resp 16 | Ht 65.0 in

## 2021-02-19 DIAGNOSIS — C3491 Malignant neoplasm of unspecified part of right bronchus or lung: Secondary | ICD-10-CM

## 2021-02-19 NOTE — Telephone Encounter (Signed)
Per 5/26 LOS, patient scheduled for 5/27 NM Bone Scan/CT Abd/Pelvis - Follow Up this afternoon  Patient is aware of the Appt's/Instructions

## 2021-02-19 NOTE — Progress Notes (Signed)
START ON PATHWAY REGIMEN - Small Cell Lung     A cycle is every 21 days:     Carboplatin      Etoposide   **Always confirm dose/schedule in your pharmacy ordering system**  Patient Characteristics: Newly Diagnosed, Preoperative or Nonsurgical Candidate (Clinical Staging), First Line, Limited Stage, Nonsurgical Candidate Therapeutic Status: Newly Diagnosed, Preoperative or Nonsurgical Candidate (Clinical Staging) AJCC T Category: cT4 AJCC N Category: cN3 AJCC M Category: cM0 AJCC 8 Stage Grouping: IIIC Stage Classification: Limited Surgical Candidacy: Nonsurgical Candidate Intent of Therapy: Curative Intent, Discussed with Patient

## 2021-02-23 ENCOUNTER — Encounter: Payer: Self-pay | Admitting: Oncology

## 2021-02-23 ENCOUNTER — Inpatient Hospital Stay: Payer: Medicare HMO | Admitting: Hematology and Oncology

## 2021-02-23 ENCOUNTER — Other Ambulatory Visit: Payer: Self-pay | Admitting: Oncology

## 2021-02-23 ENCOUNTER — Other Ambulatory Visit: Payer: Self-pay

## 2021-02-23 MED ORDER — OXYCODONE HCL 15 MG PO TABS: 15.0000 mg | ORAL_TABLET | Freq: Four times a day (QID) | ORAL | 0 refills | Status: DC | PRN

## 2021-02-23 NOTE — Telephone Encounter (Signed)
Pt called & stated that she saw Dr Bobby Rumpf on Friday and he was going to send in oxycodone and ibuprofen to [facility]. The prescriptions have not been sent per the pharmacy.    I notified Dr Bobby Rumpf of above. He sent the prescription just a few minutes ago. He recommends pt using OTC ibuprofen.  Pt called & notified. She verbalized understanding.

## 2021-02-23 NOTE — Progress Notes (Signed)
[facility]  [address] [place],  [place]  [address] [phone]  Clinic Day:  02/19/2021  Referring physician: Gardiner Rhyme, MD   HISTORY OF PRESENT ILLNESS:  The patient is a 80 y.o. female who I recently began seeing for newly diagnosed small cell lung cancer.  She comes in today to go over her scans to determine if she has limited or extensive stage disease.  Since yesterday, she has begun to have more right chest wall pain, which she is concerned is due to her rapidly growing mediastinal mass.    PHYSICAL EXAM:  Blood pressure (!) 177/84, pulse 73, temperature 99 F (37.2 C), resp. rate 16, height 5\' 5"  (1.651 m), SpO2 99 %. Wt Readings from Last 3 Encounters:  02/18/21 174 lb 3.2 oz (79 kg)   Body mass index is 28.99 kg/m. Performance status (ECOG): 2 - Symptomatic, <50% confined to bed Physical Exam Constitutional:      Appearance: Normal appearance. She is ill-appearing.     Comments: In a wheelchair; wearing oxygen per nasal canula  HENT:     Mouth/Throat:     Mouth: Mucous membranes are moist.     Pharynx: Oropharynx is clear. No oropharyngeal exudate or posterior oropharyngeal erythema.  Cardiovascular:     Rate and Rhythm: Normal rate and regular rhythm.     Heart sounds: No murmur heard. No friction rub. No gallop.   Pulmonary:     Effort: Pulmonary effort is normal. No respiratory distress.     Breath sounds: Normal breath sounds. No wheezing, rhonchi or rales.     Comments: Mildly decreased breath sounds bilaterally Chest:     Chest wall: Tenderness present.  Breasts:     Right: No axillary adenopathy or supraclavicular adenopathy.     Left: No axillary adenopathy or supraclavicular adenopathy.    Abdominal:     General: Bowel sounds are normal. There is no distension.     Palpations: Abdomen is soft. There is no mass.     Tenderness: There is no abdominal tenderness.  Musculoskeletal:        General: No swelling.      Right lower leg: No edema.     Left lower leg: No edema.  Lymphadenopathy:     Cervical: No cervical adenopathy.     Upper Body:     Right upper body: No supraclavicular or axillary adenopathy.     Left upper body: No supraclavicular or axillary adenopathy.     Lower Body: No right inguinal adenopathy. No left inguinal adenopathy.  Skin:    General: Skin is warm.     Coloration: Skin is not jaundiced.     Findings: No lesion or rash.  Neurological:     General: No focal deficit present.     Mental Status: She is alert and oriented to person, place, and time. Mental status is at baseline.     Cranial Nerves: Cranial nerves are intact.  Psychiatric:        Mood and Affect: Mood normal.        Behavior: Behavior normal.        Thought Content: Thought content normal.    SCANS:  CT scans of her abdomen/pelvis and bone scan revealed the following: FINDINGS: Lower chest: No acute abnormality.  Hepatobiliary: No solid liver abnormality is seen. No gallstones, gallbladder wall thickening, or biliary dilatation.  Pancreas: Unremarkable. No pancreatic ductal dilatation or surrounding inflammatory changes.  Spleen: Normal in size  without significant abnormality.  Adrenals/Urinary Tract: Adrenal glands are unremarkable. Exophytic fluid attenuation cyst of the superior pole of the right kidney, which is distinct from the right adrenal gland (series 2, image 15). Punctuate nonobstructive calculus of the superior pole of left kidney (series 601, image 79). Bladder is unremarkable.  Stomach/Bowel: Stomach is within normal limits. Appendix appears normal. No evidence of bowel wall thickening, distention, or inflammatory changes.  Vascular/Lymphatic: Aortic atherosclerosis. 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 No enlarged abdominal or pelvic lymph nodes.  Reproductive: No mass or other significant  abnormality.  Other: Fat containing umbilical hernia.  No abdominopelvic ascites.  Musculoskeletal: No acute or significant osseous findings.  IMPRESSION: 1. No evidence of metastatic disease in the abdomen or pelvis. 2. 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 3. Punctuate nonobstructive calculus of the superior pole of left kidney.  Aortic Atherosclerosis (ICD10-I70.0). ----------------------------------------------------------------------------------------------------------------- FINDINGS: No abnormal areas of increased or decreased radiotracer uptake to suggest osseous metastasis. Degenerative uptake identified within both shoulders, left greater than right. Lumbar scoliosis. Normal physiologic tracer activity within the kidneys an urinary bladder.  IMPRESSION: 1. No evidence for osseous metastasis. 2. Bilateral shoulder joint osteoarthritis.  ASSESSMENT & PLAN:  A 80 y.o. female whose scans today fortunately showed no evidence of metastatic disease.  Based upon this, she has limited stage small cell lung cancer.  I will employ chemoradiation immediately, with the goal being to cure her of her disease.  Her chemotherapy will consist of 4 cycles of carboplatin/etoposide.  I do not believe this patient is healthy enough to withstand 4 cycles of cisplatin; thus the reason for implementing carboplatin.  Due to the rapid growth of her tumor, I will start her 1st cycle of carboplatin/etoposide next week.  The goal is to start her radiation at the same time her 2nd cycle of carboplatin/etoposide commences.  As starting chemotherapy is of utmost importance, her port placement will be delayed until her 2nd cycle of chemotherapy commences.  She does have good venous access for her to proceed with her 1st cycle of chemotherapy peripherally.  I will see her back 3 weeks later before she heads into her 2nd cycle of carboplatin/etoposide. The patient  understands all the plans discussed today and is in agreement with them.  Quayshawn Nin Macarthur Critchley, MD

## 2021-02-24 ENCOUNTER — Inpatient Hospital Stay: Payer: Medicare HMO | Attending: Oncology

## 2021-02-24 ENCOUNTER — Other Ambulatory Visit: Payer: Self-pay

## 2021-02-24 DIAGNOSIS — C3491 Malignant neoplasm of unspecified part of right bronchus or lung: Secondary | ICD-10-CM | POA: Diagnosis present

## 2021-02-24 DIAGNOSIS — Z5111 Encounter for antineoplastic chemotherapy: Secondary | ICD-10-CM | POA: Insufficient documentation

## 2021-02-24 DIAGNOSIS — Z5189 Encounter for other specified aftercare: Secondary | ICD-10-CM | POA: Diagnosis not present

## 2021-02-24 MED ORDER — SODIUM CHLORIDE 0.9 % IV SOLN
362.0000 mg | Freq: Once | INTRAVENOUS | Status: AC
  Administered 2021-02-24: 360 mg via INTRAVENOUS
  Filled 2021-02-24: qty 36

## 2021-02-24 MED ORDER — SODIUM CHLORIDE 0.9 % IV SOLN
150.0000 mg | Freq: Once | INTRAVENOUS | Status: AC
  Administered 2021-02-24: 150 mg via INTRAVENOUS
  Filled 2021-02-24: qty 150

## 2021-02-24 MED ORDER — SODIUM CHLORIDE 0.9 % IV SOLN
100.0000 mg/m2 | Freq: Once | INTRAVENOUS | Status: AC
  Administered 2021-02-24: 190 mg via INTRAVENOUS
  Filled 2021-02-24: qty 9.5

## 2021-02-24 MED ORDER — PALONOSETRON HCL INJECTION 0.25 MG/5ML
INTRAVENOUS | Status: AC
  Filled 2021-02-24: qty 5

## 2021-02-24 MED ORDER — SODIUM CHLORIDE 0.9 % IV SOLN
10.0000 mg | Freq: Once | INTRAVENOUS | Status: AC
  Administered 2021-02-24: 10 mg via INTRAVENOUS
  Filled 2021-02-24: qty 10

## 2021-02-24 MED ORDER — SODIUM CHLORIDE 0.9 % IV SOLN
Freq: Once | INTRAVENOUS | Status: AC
Start: 2021-02-24 — End: 2021-02-24
  Filled 2021-02-24: qty 250

## 2021-02-24 MED ORDER — PALONOSETRON HCL INJECTION 0.25 MG/5ML
0.2500 mg | Freq: Once | INTRAVENOUS | Status: AC
  Administered 2021-02-24: 0.25 mg via INTRAVENOUS

## 2021-02-24 NOTE — Patient Instructions (Signed)
Carboplatin injection What is this medicine? CARBOPLATIN (KAR boe pla tin) is a chemotherapy drug. It targets fast dividing cells, like cancer cells, and causes these cells to die. This medicine is used to treat ovarian cancer and many other cancers. This medicine may be used for other purposes; ask your health care provider or pharmacist if you have questions. COMMON BRAND NAME(S): Paraplatin What should I tell my health care provider before I take this medicine? They need to know if you have any of these conditions:  blood disorders  hearing problems  kidney disease  recent or ongoing radiation therapy  an unusual or allergic reaction to carboplatin, cisplatin, other chemotherapy, other medicines, foods, dyes, or preservatives  pregnant or trying to get pregnant  breast-feeding How should I use this medicine? This drug is usually given as an infusion into a vein. It is administered in a hospital or clinic by a specially trained health care professional. Talk to your pediatrician regarding the use of this medicine in children. Special care may be needed. Overdosage: If you think you have taken too much of this medicine contact a poison control center or emergency room at once. NOTE: This medicine is only for you. Do not share this medicine with others. What if I miss a dose? It is important not to miss a dose. Call your doctor or health care professional if you are unable to keep an appointment. What may interact with this medicine?  medicines for seizures  medicines to increase blood counts like filgrastim, pegfilgrastim, sargramostim  some antibiotics like amikacin, gentamicin, neomycin, streptomycin, tobramycin  vaccines Talk to your doctor or health care professional before taking any of these medicines:  acetaminophen  aspirin  ibuprofen  ketoprofen  naproxen This list may not describe all possible interactions. Give your health care provider a list of all the  medicines, herbs, non-prescription drugs, or dietary supplements you use. Also tell them if you smoke, drink alcohol, or use illegal drugs. Some items may interact with your medicine. What should I watch for while using this medicine? Your condition will be monitored carefully while you are receiving this medicine. You will need important blood work done while you are taking this medicine. This drug may make you feel generally unwell. This is not uncommon, as chemotherapy can affect healthy cells as well as cancer cells. Report any side effects. Continue your course of treatment even though you feel ill unless your doctor tells you to stop. In some cases, you may be given additional medicines to help with side effects. Follow all directions for their use. Call your doctor or health care professional for advice if you get a fever, chills or sore throat, or other symptoms of a cold or flu. Do not treat yourself. This drug decreases your body's ability to fight infections. Try to avoid being around people who are sick. This medicine may increase your risk to bruise or bleed. Call your doctor or health care professional if you notice any unusual bleeding. Be careful brushing and flossing your teeth or using a toothpick because you may get an infection or bleed more easily. If you have any dental work done, tell your dentist you are receiving this medicine. Avoid taking products that contain aspirin, acetaminophen, ibuprofen, naproxen, or ketoprofen unless instructed by your doctor. These medicines may hide a fever. Do not become pregnant while taking this medicine. Women should inform their doctor if they wish to become pregnant or think they might be pregnant. There is a  potential for serious side effects to an unborn child. Talk to your health care professional or pharmacist for more information. Do not breast-feed an infant while taking this medicine. What side effects may I notice from receiving this  medicine? Side effects that you should report to your doctor or health care professional as soon as possible:  allergic reactions like skin rash, itching or hives, swelling of the face, lips, or tongue  signs of infection - fever or chills, cough, sore throat, pain or difficulty passing urine  signs of decreased platelets or bleeding - bruising, pinpoint red spots on the skin, black, tarry stools, nosebleeds  signs of decreased red blood cells - unusually weak or tired, fainting spells, lightheadedness  breathing problems  changes in hearing  changes in vision  chest pain  high blood pressure  low blood counts - This drug may decrease the number of white blood cells, red blood cells and platelets. You may be at increased risk for infections and bleeding.  nausea and vomiting  pain, swelling, redness or irritation at the injection site  pain, tingling, numbness in the hands or feet  problems with balance, talking, walking  trouble passing urine or change in the amount of urine Side effects that usually do not require medical attention (report to your doctor or health care professional if they continue or are bothersome):  hair loss  loss of appetite  metallic taste in the mouth or changes in taste This list may not describe all possible side effects. Call your doctor for medical advice about side effects. You may report side effects to FDA at 1-800-FDA-1088. Where should I keep my medicine? This drug is given in a hospital or clinic and will not be stored at home. NOTE: This sheet is a summary. It may not cover all possible information. If you have questions about this medicine, talk to your doctor, pharmacist, or health care provider.  2021 Elsevier/Gold Standard (2007-12-18 14:38:05) Etoposide, VP-16 injection What is this medicine? ETOPOSIDE, VP-16 (e toe POE side) is a chemotherapy drug. It is used to treat testicular cancer, lung cancer, and other cancers. This  medicine may be used for other purposes; ask your health care provider or pharmacist if you have questions. COMMON BRAND NAME(S): Etopophos, Toposar, VePesid What should I tell my health care provider before I take this medicine? They need to know if you have any of these conditions:  infection  kidney disease  liver disease  low blood counts, like low white cell, platelet, or red cell counts  an unusual or allergic reaction to etoposide, other medicines, foods, dyes, or preservatives  pregnant or trying to get pregnant  breast-feeding How should I use this medicine? This medicine is for infusion into a vein. It is administered in a hospital or clinic by a specially trained health care professional. Talk to your pediatrician regarding the use of this medicine in children. Special care may be needed. Overdosage: If you think you have taken too much of this medicine contact a poison control center or emergency room at once. NOTE: This medicine is only for you. Do not share this medicine with others. What if I miss a dose? It is important not to miss your dose. Call your doctor or health care professional if you are unable to keep an appointment. What may interact with this medicine? This medicine may interact with the following medications:  warfarin This list may not describe all possible interactions. Give your health care provider a list  of all the medicines, herbs, non-prescription drugs, or dietary supplements you use. Also tell them if you smoke, drink alcohol, or use illegal drugs. Some items may interact with your medicine. What should I watch for while using this medicine? Visit your doctor for checks on your progress. This drug may make you feel generally unwell. This is not uncommon, as chemotherapy can affect healthy cells as well as cancer cells. Report any side effects. Continue your course of treatment even though you feel ill unless your doctor tells you to stop. In some  cases, you may be given additional medicines to help with side effects. Follow all directions for their use. Call your doctor or health care professional for advice if you get a fever, chills or sore throat, or other symptoms of a cold or flu. Do not treat yourself. This drug decreases your body's ability to fight infections. Try to avoid being around people who are sick. This medicine may increase your risk to bruise or bleed. Call your doctor or health care professional if you notice any unusual bleeding. Talk to your doctor about your risk of cancer. You may be more at risk for certain types of cancers if you take this medicine. Do not become pregnant while taking this medicine or for at least 6 months after stopping it. Women should inform their doctor if they wish to become pregnant or think they might be pregnant. Women of child-bearing potential will need to have a negative pregnancy test before starting this medicine. There is a potential for serious side effects to an unborn child. Talk to your health care professional or pharmacist for more information. Do not breast-feed an infant while taking this medicine. Men must use a latex condom during sexual contact with a woman while taking this medicine and for at least 4 months after stopping it. A latex condom is needed even if you have had a vasectomy. Contact your doctor right away if your partner becomes pregnant. Do not donate sperm while taking this medicine and for at least 4 months after you stop taking this medicine. Men should inform their doctors if they wish to father a child. This medicine may lower sperm counts. What side effects may I notice from receiving this medicine? Side effects that you should report to your doctor or health care professional as soon as possible:  allergic reactions like skin rash, itching or hives, swelling of the face, lips, or tongue  low blood counts - this medicine may decrease the number of white blood  cells, red blood cells, and platelets. You may be at increased risk for infections and bleeding  nausea, vomiting  redness, blistering, peeling or loosening of the skin, including inside the mouth  signs and symptoms of infection like fever; chills; cough; sore throat; pain or trouble passing urine  signs and symptoms of low red blood cells or anemia such as unusually weak or tired; feeling faint or lightheaded; falls; breathing problems  unusual bruising or bleeding Side effects that usually do not require medical attention (report to your doctor or health care professional if they continue or are bothersome):  changes in taste  diarrhea  hair loss  loss of appetite  mouth sores This list may not describe all possible side effects. Call your doctor for medical advice about side effects. You may report side effects to FDA at 1-800-FDA-1088. Where should I keep my medicine? This drug is given in a hospital or clinic and will not be stored at home.  NOTE: This sheet is a summary. It may not cover all possible information. If you have questions about this medicine, talk to your doctor, pharmacist, or health care provider.  2021 Elsevier/Gold Standard (2018-11-07 16:57:15)

## 2021-02-25 ENCOUNTER — Inpatient Hospital Stay: Payer: Medicare HMO

## 2021-02-25 ENCOUNTER — Other Ambulatory Visit: Payer: Self-pay | Admitting: Hematology and Oncology

## 2021-02-25 ENCOUNTER — Inpatient Hospital Stay: Payer: Medicare HMO | Admitting: Oncology

## 2021-02-25 ENCOUNTER — Other Ambulatory Visit: Payer: Self-pay

## 2021-02-25 VITALS — BP 161/74 | HR 77 | Temp 98.1°F | Resp 18 | Ht 65.0 in | Wt 171.8 lb

## 2021-02-25 DIAGNOSIS — C3491 Malignant neoplasm of unspecified part of right bronchus or lung: Secondary | ICD-10-CM

## 2021-02-25 DIAGNOSIS — Z5111 Encounter for antineoplastic chemotherapy: Secondary | ICD-10-CM | POA: Diagnosis not present

## 2021-02-25 MED ORDER — SODIUM CHLORIDE 0.9 % IV SOLN
100.0000 mg/m2 | Freq: Once | INTRAVENOUS | Status: AC
  Administered 2021-02-25: 190 mg via INTRAVENOUS
  Filled 2021-02-25: qty 9.5

## 2021-02-25 MED ORDER — PROCHLORPERAZINE MALEATE 10 MG PO TABS: 10.0000 mg | ORAL_TABLET | Freq: Four times a day (QID) | ORAL | 3 refills | Status: AC | PRN

## 2021-02-25 MED ORDER — SODIUM CHLORIDE 0.9 % IV SOLN
Freq: Once | INTRAVENOUS | Status: AC
  Filled 2021-02-25: qty 250

## 2021-02-25 MED ORDER — SODIUM CHLORIDE 0.9 % IV SOLN
10.0000 mg | Freq: Once | INTRAVENOUS | Status: AC
  Administered 2021-02-25: 10 mg via INTRAVENOUS
  Filled 2021-02-25: qty 10

## 2021-02-25 NOTE — Patient Instructions (Signed)
Etoposide, VP-16 injection What is this medicine? ETOPOSIDE, VP-16 (e toe POE side) is a chemotherapy drug. It is used to treat testicular cancer, lung cancer, and other cancers. This medicine may be used for other purposes; ask your health care provider or pharmacist if you have questions. COMMON BRAND NAME(S): Etopophos, Toposar, VePesid What should I tell my health care provider before I take this medicine? They need to know if you have any of these conditions:  infection  kidney disease  liver disease  low blood counts, like low white cell, platelet, or red cell counts  an unusual or allergic reaction to etoposide, other medicines, foods, dyes, or preservatives  pregnant or trying to get pregnant  breast-feeding How should I use this medicine? This medicine is for infusion into a vein. It is administered in a hospital or clinic by a specially trained health care professional. Talk to your pediatrician regarding the use of this medicine in children. Special care may be needed. Overdosage: If you think you have taken too much of this medicine contact a poison control center or emergency room at once. NOTE: This medicine is only for you. Do not share this medicine with others. What if I miss a dose? It is important not to miss your dose. Call your doctor or health care professional if you are unable to keep an appointment. What may interact with this medicine? This medicine may interact with the following medications:  warfarin This list may not describe all possible interactions. Give your health care provider a list of all the medicines, herbs, non-prescription drugs, or dietary supplements you use. Also tell them if you smoke, drink alcohol, or use illegal drugs. Some items may interact with your medicine. What should I watch for while using this medicine? Visit your doctor for checks on your progress. This drug may make you feel generally unwell. This is not uncommon, as  chemotherapy can affect healthy cells as well as cancer cells. Report any side effects. Continue your course of treatment even though you feel ill unless your doctor tells you to stop. In some cases, you may be given additional medicines to help with side effects. Follow all directions for their use. Call your doctor or health care professional for advice if you get a fever, chills or sore throat, or other symptoms of a cold or flu. Do not treat yourself. This drug decreases your body's ability to fight infections. Try to avoid being around people who are sick. This medicine may increase your risk to bruise or bleed. Call your doctor or health care professional if you notice any unusual bleeding. Talk to your doctor about your risk of cancer. You may be more at risk for certain types of cancers if you take this medicine. Do not become pregnant while taking this medicine or for at least 6 months after stopping it. Women should inform their doctor if they wish to become pregnant or think they might be pregnant. Women of child-bearing potential will need to have a negative pregnancy test before starting this medicine. There is a potential for serious side effects to an unborn child. Talk to your health care professional or pharmacist for more information. Do not breast-feed an infant while taking this medicine. Men must use a latex condom during sexual contact with a woman while taking this medicine and for at least 4 months after stopping it. A latex condom is needed even if you have had a vasectomy. Contact your doctor right away if your partner  becomes pregnant. Do not donate sperm while taking this medicine and for at least 4 months after you stop taking this medicine. Men should inform their doctors if they wish to father a child. This medicine may lower sperm counts. What side effects may I notice from receiving this medicine? Side effects that you should report to your doctor or health care professional  as soon as possible:  allergic reactions like skin rash, itching or hives, swelling of the face, lips, or tongue  low blood counts - this medicine may decrease the number of white blood cells, red blood cells, and platelets. You may be at increased risk for infections and bleeding  nausea, vomiting  redness, blistering, peeling or loosening of the skin, including inside the mouth  signs and symptoms of infection like fever; chills; cough; sore throat; pain or trouble passing urine  signs and symptoms of low red blood cells or anemia such as unusually weak or tired; feeling faint or lightheaded; falls; breathing problems  unusual bruising or bleeding Side effects that usually do not require medical attention (report to your doctor or health care professional if they continue or are bothersome):  changes in taste  diarrhea  hair loss  loss of appetite  mouth sores This list may not describe all possible side effects. Call your doctor for medical advice about side effects. You may report side effects to FDA at 1-800-FDA-1088. Where should I keep my medicine? This drug is given in a hospital or clinic and will not be stored at home. NOTE: This sheet is a summary. It may not cover all possible information. If you have questions about this medicine, talk to your doctor, pharmacist, or health care provider.  2021 Elsevier/Gold Standard (2018-11-07 16:57:15)

## 2021-02-25 NOTE — Progress Notes (Signed)
compaxine

## 2021-02-26 ENCOUNTER — Other Ambulatory Visit: Payer: Self-pay | Admitting: Hematology and Oncology

## 2021-02-26 ENCOUNTER — Telehealth: Payer: Self-pay

## 2021-02-26 ENCOUNTER — Inpatient Hospital Stay: Payer: Medicare HMO

## 2021-02-26 VITALS — BP 172/74 | HR 78 | Temp 98.2°F | Resp 18 | Ht 65.0 in | Wt 172.0 lb

## 2021-02-26 DIAGNOSIS — C3491 Malignant neoplasm of unspecified part of right bronchus or lung: Secondary | ICD-10-CM

## 2021-02-26 DIAGNOSIS — Z5111 Encounter for antineoplastic chemotherapy: Secondary | ICD-10-CM | POA: Diagnosis not present

## 2021-02-26 MED ORDER — SODIUM CHLORIDE 0.9 % IV SOLN
10.0000 mg | Freq: Once | INTRAVENOUS | Status: AC
  Administered 2021-02-26: 10 mg via INTRAVENOUS
  Filled 2021-02-26: qty 10

## 2021-02-26 MED ORDER — SODIUM CHLORIDE 0.9 % IV SOLN
Freq: Once | INTRAVENOUS | Status: AC
  Filled 2021-02-26: qty 250

## 2021-02-26 MED ORDER — SODIUM CHLORIDE 0.9 % IV SOLN
100.0000 mg/m2 | Freq: Once | INTRAVENOUS | Status: AC
  Administered 2021-02-26: 190 mg via INTRAVENOUS
  Filled 2021-02-26: qty 9.5

## 2021-02-26 MED ORDER — LORAZEPAM 0.5 MG PO TABS: 0.5000 mg | ORAL_TABLET | Freq: Three times a day (TID) | ORAL | 0 refills | Status: DC | PRN

## 2021-02-26 NOTE — Telephone Encounter (Signed)
I spoke with pt's daughter. She reports that pt is having nausea, & is requesting a medication for that. I reviewed the med list and told her that 2 different nausea edications were sent in yesterday. I asked her to alternate the 2 nausea medications to keep her mom comfortable. No emesis. Pt is drinking, but appetite isn't good. Pt's dtr,Faith, also requests something for her mom's anxiety. "She is really anxious. She used Ativan when in hospital and it helped". I sent message to Lenna Sciara, NP regarding request for anxiety medication. Pt has had some diarrhea. I asked Faith if there was Imodium in the home? She replied, No". I asked her to pick up some Imodium, when she goes to pharmacy to get nausea & anxiety meds. I reviewed the appropriate way to take the Imodium up to 8 tabs/day if needed. I reminded Faith of the importance of calling us DAY OR NIGHT, if temp is 100.4 or higher. She verbalized understanding.

## 2021-02-26 NOTE — Patient Instructions (Signed)
Etoposide, VP-16 injection What is this medicine? ETOPOSIDE, VP-16 (e toe POE side) is a chemotherapy drug. It is used to treat testicular cancer, lung cancer, and other cancers. This medicine may be used for other purposes; ask your health care provider or pharmacist if you have questions. COMMON BRAND NAME(S): Etopophos, Toposar, VePesid What should I tell my health care provider before I take this medicine? They need to know if you have any of these conditions:  infection  kidney disease  liver disease  low blood counts, like low white cell, platelet, or red cell counts  an unusual or allergic reaction to etoposide, other medicines, foods, dyes, or preservatives  pregnant or trying to get pregnant  breast-feeding How should I use this medicine? This medicine is for infusion into a vein. It is administered in a hospital or clinic by a specially trained health care professional. Talk to your pediatrician regarding the use of this medicine in children. Special care may be needed. Overdosage: If you think you have taken too much of this medicine contact a poison control center or emergency room at once. NOTE: This medicine is only for you. Do not share this medicine with others. What if I miss a dose? It is important not to miss your dose. Call your doctor or health care professional if you are unable to keep an appointment. What may interact with this medicine? This medicine may interact with the following medications:  warfarin This list may not describe all possible interactions. Give your health care provider a list of all the medicines, herbs, non-prescription drugs, or dietary supplements you use. Also tell them if you smoke, drink alcohol, or use illegal drugs. Some items may interact with your medicine. What should I watch for while using this medicine? Visit your doctor for checks on your progress. This drug may make you feel generally unwell. This is not uncommon, as  chemotherapy can affect healthy cells as well as cancer cells. Report any side effects. Continue your course of treatment even though you feel ill unless your doctor tells you to stop. In some cases, you may be given additional medicines to help with side effects. Follow all directions for their use. Call your doctor or health care professional for advice if you get a fever, chills or sore throat, or other symptoms of a cold or flu. Do not treat yourself. This drug decreases your body's ability to fight infections. Try to avoid being around people who are sick. This medicine may increase your risk to bruise or bleed. Call your doctor or health care professional if you notice any unusual bleeding. Talk to your doctor about your risk of cancer. You may be more at risk for certain types of cancers if you take this medicine. Do not become pregnant while taking this medicine or for at least 6 months after stopping it. Women should inform their doctor if they wish to become pregnant or think they might be pregnant. Women of child-bearing potential will need to have a negative pregnancy test before starting this medicine. There is a potential for serious side effects to an unborn child. Talk to your health care professional or pharmacist for more information. Do not breast-feed an infant while taking this medicine. Men must use a latex condom during sexual contact with a woman while taking this medicine and for at least 4 months after stopping it. A latex condom is needed even if you have had a vasectomy. Contact your doctor right away if your partner  becomes pregnant. Do not donate sperm while taking this medicine and for at least 4 months after you stop taking this medicine. Men should inform their doctors if they wish to father a child. This medicine may lower sperm counts. What side effects may I notice from receiving this medicine? Side effects that you should report to your doctor or health care professional  as soon as possible:  allergic reactions like skin rash, itching or hives, swelling of the face, lips, or tongue  low blood counts - this medicine may decrease the number of white blood cells, red blood cells, and platelets. You may be at increased risk for infections and bleeding  nausea, vomiting  redness, blistering, peeling or loosening of the skin, including inside the mouth  signs and symptoms of infection like fever; chills; cough; sore throat; pain or trouble passing urine  signs and symptoms of low red blood cells or anemia such as unusually weak or tired; feeling faint or lightheaded; falls; breathing problems  unusual bruising or bleeding Side effects that usually do not require medical attention (report to your doctor or health care professional if they continue or are bothersome):  changes in taste  diarrhea  hair loss  loss of appetite  mouth sores This list may not describe all possible side effects. Call your doctor for medical advice about side effects. You may report side effects to FDA at 1-800-FDA-1088. Where should I keep my medicine? This drug is given in a hospital or clinic and will not be stored at home. NOTE: This sheet is a summary. It may not cover all possible information. If you have questions about this medicine, talk to your doctor, pharmacist, or health care provider.  2021 Elsevier/Gold Standard (2018-11-07 16:57:15)

## 2021-03-01 ENCOUNTER — Encounter: Payer: Self-pay | Admitting: Oncology

## 2021-03-01 ENCOUNTER — Other Ambulatory Visit: Payer: Self-pay | Admitting: Hematology and Oncology

## 2021-03-01 ENCOUNTER — Other Ambulatory Visit: Payer: Self-pay

## 2021-03-01 ENCOUNTER — Inpatient Hospital Stay: Payer: Medicare HMO

## 2021-03-01 VITALS — BP 153/74 | HR 77 | Temp 98.3°F | Resp 18 | Ht 65.0 in | Wt 172.0 lb

## 2021-03-01 DIAGNOSIS — C3491 Malignant neoplasm of unspecified part of right bronchus or lung: Secondary | ICD-10-CM

## 2021-03-01 DIAGNOSIS — Z5111 Encounter for antineoplastic chemotherapy: Secondary | ICD-10-CM | POA: Diagnosis not present

## 2021-03-01 MED ORDER — PEGFILGRASTIM-JMDB 6 MG/0.6ML ~~LOC~~ SOSY
6.0000 mg | PREFILLED_SYRINGE | Freq: Once | SUBCUTANEOUS | Status: AC
  Administered 2021-03-01: 6 mg via SUBCUTANEOUS

## 2021-03-01 MED ORDER — PEGFILGRASTIM-JMDB 6 MG/0.6ML ~~LOC~~ SOSY
PREFILLED_SYRINGE | SUBCUTANEOUS | Status: AC
  Filled 2021-03-01: qty 0.6

## 2021-03-01 MED ORDER — LORAZEPAM 0.5 MG PO TABS: 0.5000 mg | ORAL_TABLET | Freq: Three times a day (TID) | ORAL | 0 refills | Status: DC | PRN

## 2021-03-01 NOTE — Patient Instructions (Signed)
Pegfilgrastim injection What is this medicine? PEGFILGRASTIM (PEG fil gra stim) is a long-acting granulocyte colony-stimulating factor that stimulates the growth of neutrophils, a type of white blood cell important in the body's fight against infection. It is used to reduce the incidence of fever and infection in patients with certain types of cancer who are receiving chemotherapy that affects the bone marrow, and to increase survival after being exposed to high doses of radiation. This medicine may be used for other purposes; ask your health care provider or pharmacist if you have questions. COMMON BRAND NAME(S): Fulphila, Neulasta, Nyvepria, UDENYCA, Ziextenzo What should I tell my health care provider before I take this medicine? They need to know if you have any of these conditions:  kidney disease  latex allergy  ongoing radiation therapy  sickle cell disease  skin reactions to acrylic adhesives (On-Body Injector only)  an unusual or allergic reaction to pegfilgrastim, filgrastim, other medicines, foods, dyes, or preservatives  pregnant or trying to get pregnant  breast-feeding How should I use this medicine? This medicine is for injection under the skin. If you get this medicine at home, you will be taught how to prepare and give the pre-filled syringe or how to use the On-body Injector. Refer to the patient Instructions for Use for detailed instructions. Use exactly as directed. Tell your healthcare provider immediately if you suspect that the On-body Injector may not have performed as intended or if you suspect the use of the On-body Injector resulted in a missed or partial dose. It is important that you put your used needles and syringes in a special sharps container. Do not put them in a trash can. If you do not have a sharps container, call your pharmacist or healthcare provider to get one. Talk to your pediatrician regarding the use of this medicine in children. While this drug  may be prescribed for selected conditions, precautions do apply. Overdosage: If you think you have taken too much of this medicine contact a poison control center or emergency room at once. NOTE: This medicine is only for you. Do not share this medicine with others. What if I miss a dose? It is important not to miss your dose. Call your doctor or health care professional if you miss your dose. If you miss a dose due to an On-body Injector failure or leakage, a new dose should be administered as soon as possible using a single prefilled syringe for manual use. What may interact with this medicine? Interactions have not been studied. This list may not describe all possible interactions. Give your health care provider a list of all the medicines, herbs, non-prescription drugs, or dietary supplements you use. Also tell them if you smoke, drink alcohol, or use illegal drugs. Some items may interact with your medicine. What should I watch for while using this medicine? Your condition will be monitored carefully while you are receiving this medicine. You may need blood work done while you are taking this medicine. Talk to your health care provider about your risk of cancer. You may be more at risk for certain types of cancer if you take this medicine. If you are going to need a MRI, CT scan, or other procedure, tell your doctor that you are using this medicine (On-Body Injector only). What side effects may I notice from receiving this medicine? Side effects that you should report to your doctor or health care professional as soon as possible:  allergic reactions (skin rash, itching or hives, swelling of   the face, lips, or tongue)  back pain  dizziness  fever  pain, redness, or irritation at site where injected  pinpoint red spots on the skin  red or dark-brown urine  shortness of breath or breathing problems  stomach or side pain, or pain at the shoulder  swelling  tiredness  trouble  passing urine or change in the amount of urine  unusual bruising or bleeding Side effects that usually do not require medical attention (report to your doctor or health care professional if they continue or are bothersome):  bone pain  muscle pain This list may not describe all possible side effects. Call your doctor for medical advice about side effects. You may report side effects to FDA at 1-800-FDA-1088. Where should I keep my medicine? Keep out of the reach of children. If you are using this medicine at home, you will be instructed on how to store it. Throw away any unused medicine after the expiration date on the label. NOTE: This sheet is a summary. It may not cover all possible information. If you have questions about this medicine, talk to your doctor, pharmacist, or health care provider.  2021 Elsevier/Gold Standard (2019-10-04 13:20:51)  

## 2021-03-02 ENCOUNTER — Encounter: Payer: Self-pay | Admitting: Oncology

## 2021-03-04 ENCOUNTER — Encounter: Payer: Self-pay | Admitting: Oncology

## 2021-03-04 ENCOUNTER — Other Ambulatory Visit: Payer: Self-pay | Admitting: Hematology and Oncology

## 2021-03-04 ENCOUNTER — Telehealth: Payer: Self-pay

## 2021-03-04 NOTE — Telephone Encounter (Addendum)
I spoke with Stephanie Garza & notified her of below. I also told her that if her mom's pain continued to get worse, to take her to the emergency room. She replied, "ok". Stephanie Garza also asked how long did I think it would take for them to see her at pain clinic? I told her I wasn't sure, but hopefully soon.   ----- Message from Melodye Ped, NP sent at 03/04/2021 12:00 PM EDT ----- Regarding: RE: Uncontrolled pain Contact: [phone] I have spoken to Dr. Bobby Rumpf and there is some concern for drug diversion in the family, so if she is still having this much pain, we need to refer her to pain management. I will get on that today.  ----- Message ----- From: Dairl Ponder, RN Sent: 03/04/2021  11:19 AM EDT To: Melodye Ped, NP Subject: Uncontrolled pain                              Pt's daughter, Kyra Searles called to report her mom is having increased pain. She states, "There isn't any reason for hurt to hurt like that when maybe her medication can be changed. She is seeing Dr Orlene Erm right now for radiation. She hasn't seen Dr Bobby Rumpf in several days". Stephanie Garza requests that we send something into [facility] if possible.    Stephanie Garza called twice, so I forwarded the last call to you [name] for you to hear it. Thanks!

## 2021-03-09 ENCOUNTER — Other Ambulatory Visit: Payer: Self-pay | Admitting: Oncology

## 2021-03-09 DIAGNOSIS — C3491 Malignant neoplasm of unspecified part of right bronchus or lung: Secondary | ICD-10-CM | POA: Diagnosis not present

## 2021-03-15 ENCOUNTER — Inpatient Hospital Stay: Payer: Medicare HMO | Admitting: Oncology

## 2021-03-15 ENCOUNTER — Inpatient Hospital Stay: Payer: Medicare HMO

## 2021-03-17 ENCOUNTER — Inpatient Hospital Stay: Payer: Medicare HMO

## 2021-03-18 ENCOUNTER — Inpatient Hospital Stay: Payer: Medicare HMO

## 2021-03-19 ENCOUNTER — Inpatient Hospital Stay: Payer: Medicare HMO

## 2021-03-21 ENCOUNTER — Other Ambulatory Visit: Payer: Self-pay | Admitting: Oncology

## 2021-03-21 DIAGNOSIS — C3491 Malignant neoplasm of unspecified part of right bronchus or lung: Secondary | ICD-10-CM

## 2021-03-22 ENCOUNTER — Inpatient Hospital Stay: Payer: Medicare HMO | Admitting: Oncology

## 2021-03-22 ENCOUNTER — Telehealth: Payer: Self-pay | Admitting: Oncology

## 2021-03-22 ENCOUNTER — Other Ambulatory Visit: Payer: Self-pay | Admitting: Oncology

## 2021-03-22 ENCOUNTER — Telehealth: Payer: Self-pay | Admitting: Genetic Counselor

## 2021-03-22 ENCOUNTER — Encounter: Payer: Self-pay | Admitting: Genetic Counselor

## 2021-03-22 ENCOUNTER — Inpatient Hospital Stay: Payer: Medicare HMO

## 2021-03-22 ENCOUNTER — Encounter: Payer: Self-pay | Admitting: Oncology

## 2021-03-22 ENCOUNTER — Ambulatory Visit: Payer: Medicare HMO

## 2021-03-22 ENCOUNTER — Other Ambulatory Visit: Payer: Self-pay | Admitting: Pharmacist

## 2021-03-22 ENCOUNTER — Inpatient Hospital Stay (INDEPENDENT_AMBULATORY_CARE_PROVIDER_SITE_OTHER): Payer: Medicare HMO | Admitting: Oncology

## 2021-03-22 VITALS — BP 179/79 | HR 93 | Temp 98.8°F | Resp 16 | Ht 66.0 in | Wt 164.5 lb

## 2021-03-22 DIAGNOSIS — C3491 Malignant neoplasm of unspecified part of right bronchus or lung: Secondary | ICD-10-CM

## 2021-03-22 LAB — BASIC METABOLIC PANEL
BUN: 16 (ref 4–21)
CO2: 24 — AB (ref 13–22)
Chloride: 104 (ref 99–108)
Creatinine: 1 (ref 0.5–1.1)
Glucose: 106
Potassium: 4 (ref 3.4–5.3)
Sodium: 137 (ref 137–147)

## 2021-03-22 LAB — HEPATIC FUNCTION PANEL
ALT: 12 (ref 7–35)
AST: 17 (ref 13–35)
Alkaline Phosphatase: 85 (ref 25–125)
Bilirubin, Total: 0.3

## 2021-03-22 LAB — CBC AND DIFFERENTIAL
HCT: 30 — AB (ref 36–46)
Hemoglobin: 10 — AB (ref 12.0–16.0)
Neutrophils Absolute: 5.82
Platelets: 416 — AB (ref 150–399)
WBC: 6.4

## 2021-03-22 LAB — CBC: RBC: 3.36 — AB (ref 3.87–5.11)

## 2021-03-22 LAB — COMPREHENSIVE METABOLIC PANEL
Albumin: 3.7 (ref 3.5–5.0)
Calcium: 7.9 — AB (ref 8.7–10.7)

## 2021-03-22 MED FILL — Etoposide Inj 1 GM/50ML (20 MG/ML): INTRAVENOUS | Qty: 9.5 | Status: AC

## 2021-03-22 MED FILL — Dexamethasone Sodium Phosphate Inj 100 MG/10ML: INTRAMUSCULAR | Qty: 1 | Status: AC

## 2021-03-22 MED FILL — Carboplatin IV Soln 450 MG/45ML: INTRAVENOUS | Qty: 36 | Status: AC

## 2021-03-22 MED FILL — Fosaprepitant Dimeglumine For IV Infusion 150 MG (Base Eq): INTRAVENOUS | Qty: 5 | Status: AC

## 2021-03-22 NOTE — Progress Notes (Signed)
Do not administer pegfilgrastim with cycle 2 Carbo/etoposide since pt is starting radiation per Dr. Bobby Rumpf.

## 2021-03-22 NOTE — Telephone Encounter (Signed)
Patient's Infusion reinstated [facility] - Gave daughter updated Appt Calendar

## 2021-03-22 NOTE — Telephone Encounter (Signed)
Patient rescheduled from Morning Follow Up to 3:00 pm this afternoon.  Gave S Ritter/Tuesdee the Appt

## 2021-03-22 NOTE — Telephone Encounter (Signed)
Called to discuss Stephanie Garza completed at [facility].  Pt indicated she has a family member with a hereditary cancer mutation.  LVM requesting call back to discuss genetics.

## 2021-03-23 ENCOUNTER — Ambulatory Visit: Payer: Medicare HMO

## 2021-03-23 ENCOUNTER — Inpatient Hospital Stay: Payer: Medicare HMO

## 2021-03-24 ENCOUNTER — Other Ambulatory Visit: Payer: Self-pay | Admitting: Hematology and Oncology

## 2021-03-24 ENCOUNTER — Ambulatory Visit: Payer: Medicare HMO

## 2021-03-24 ENCOUNTER — Inpatient Hospital Stay: Payer: Medicare HMO

## 2021-03-24 ENCOUNTER — Encounter: Payer: Self-pay | Admitting: Oncology

## 2021-03-24 MED ORDER — LORAZEPAM 1 MG PO TABS: 1.0000 mg | ORAL_TABLET | Freq: Four times a day (QID) | ORAL | 0 refills | Status: DC | PRN

## 2021-03-25 ENCOUNTER — Ambulatory Visit: Payer: Medicare HMO

## 2021-03-25 ENCOUNTER — Inpatient Hospital Stay: Payer: Medicare HMO

## 2021-03-25 ENCOUNTER — Telehealth: Payer: Self-pay

## 2021-03-25 ENCOUNTER — Other Ambulatory Visit: Payer: Self-pay | Admitting: Hematology and Oncology

## 2021-03-25 MED ORDER — DIPHENOXYLATE-ATROPINE 2.5-0.025 MG PO TABS: 1.0000 | ORAL_TABLET | Freq: Four times a day (QID) | ORAL | 0 refills | Status: DC | PRN

## 2021-03-25 NOTE — Telephone Encounter (Signed)
Pt's daughter,Dana, called to repot that her om needs something stronger for diarrhea. The Imodium isn't helping.  I notified [name] P,NP, send prescribed some Lomotil.

## 2021-03-26 ENCOUNTER — Ambulatory Visit: Payer: Medicare HMO

## 2021-03-31 ENCOUNTER — Encounter: Payer: Self-pay | Admitting: Oncology

## 2021-03-31 NOTE — Progress Notes (Signed)
[facility]  [address] [place],  [place]  [address] [phone]  Clinic Day:  03/22/2021  Referring physician: Gardiner Rhyme, MD   HISTORY OF PRESENT ILLNESS:  The patient is a 80 y.o. female with limited stage small cell lung cancer.  She comes in today to be evaluated before heading into her 2nd cycle of carboplatin/etoposide.  Of note, the patient was hospitalized after her 1st cycle of chemotherapy due to neutropenic fevers.  Although the patient was discharged from the hospital, her baseline health remains poor.  She is very weak and reliant upon her family to help her with her activities of daily living.  Although she wants to proceed with treatment, her health remains somewhat tenuous.  PHYSICAL EXAM:  Blood pressure (!) 179/79, pulse 93, temperature 98.8 F (37.1 C), resp. rate 16, height 5\' 6"  (1.676 m), weight 164 lb 8 oz (74.6 kg), SpO2 97 %. Wt Readings from Last 3 Encounters:  03/22/21 164 lb 8 oz (74.6 kg)  03/01/21 172 lb (78 kg)  02/26/21 172 lb (78 kg)   Body mass index is 26.55 kg/m. Performance status (ECOG): 3 Physical Exam Constitutional:      Appearance: Normal appearance. She is ill-appearing.     Comments: In a wheelchair; wearing oxygen per nasal canula  HENT:     Mouth/Throat:     Mouth: Mucous membranes are moist.     Pharynx: Oropharynx is clear. No oropharyngeal exudate or posterior oropharyngeal erythema.  Cardiovascular:     Rate and Rhythm: Normal rate and regular rhythm.     Heart sounds: No murmur heard.   No friction rub. No gallop.  Pulmonary:     Effort: Pulmonary effort is normal. No respiratory distress.     Breath sounds: Normal breath sounds. No wheezing, rhonchi or rales.     Comments: Mildly decreased breath sounds bilaterally Chest:     Chest wall: No tenderness.  Breasts:    Right: No axillary adenopathy or supraclavicular adenopathy.     Left: No axillary adenopathy or supraclavicular  adenopathy.  Abdominal:     General: Bowel sounds are normal. There is no distension.     Palpations: Abdomen is soft. There is no mass.     Tenderness: There is no abdominal tenderness.  Musculoskeletal:        General: No swelling.     Right lower leg: No edema.     Left lower leg: No edema.  Lymphadenopathy:     Cervical: No cervical adenopathy.     Upper Body:     Right upper body: No supraclavicular or axillary adenopathy.     Left upper body: No supraclavicular or axillary adenopathy.     Lower Body: No right inguinal adenopathy. No left inguinal adenopathy.  Skin:    General: Skin is warm.     Coloration: Skin is not jaundiced.     Findings: No lesion or rash.  Neurological:     General: No focal deficit present.     Mental Status: She is alert and oriented to person, place, and time. Mental status is at baseline.     Cranial Nerves: Cranial nerves are intact.  Psychiatric:        Mood and Affect: Mood normal.        Behavior: Behavior normal.        Thought Content: Thought content normal.   LABS:   Ref. Range 03/22/2021 00:00  Sodium Latest Ref Range: 137 -  147  137  Potassium Latest Ref Range: 3.4 - 5.3  4.0  Chloride Latest Ref Range: 99 - 108  104  CO2 Latest Ref Range: 13 - 22  24 (A)  Glucose Unknown 106  BUN Latest Ref Range: 4 - 21  16  Creatinine Latest Ref Range: 0.5 - 1.1  1.0  Calcium Latest Ref Range: 8.7 - 10.7  7.9 (A)  Alkaline Phosphatase Latest Ref Range: 25 - 125  85  Albumin Latest Ref Range: 3.5 - 5.0  3.7  AST Latest Ref Range: 13 - 35  17  ALT Latest Ref Range: 7 - 35  12  Bilirubin, Total Unknown 0.3  WBC Unknown 6.4  RBC Latest Ref Range: 3.87 - 5.11  3.36 (A)  Hemoglobin Latest Ref Range: 12.0 - 16.0  10.0 (A)  HCT Latest Ref Range: 36 - 46  30 (A)  Platelets Latest Ref Range: 150 - 399  416 (A)  NEUT# Unknown 5.82    ASSESSMENT & PLAN:  A 80 y.o. female with limited stage small cell lung cancer.  When evaluating her health today,  she is clearly not in any shape to proceed with her 2nd cycle of carboplatin/etoposide.  Frankly, I am concerned her health may never improve to where it would be feasible again to give her additional chemotherapy.  I will leave it up to radiation oncology to determine if they wish to give her additional radiation.  I will see her back in 3 weeks to see if her health has improved to where additional cycles of chemotherapy can even be considered. The patient understands all the plans discussed today and is in agreement with them.  Lashai Grosch Macarthur Critchley, MD

## 2021-04-07 ENCOUNTER — Telehealth: Payer: Self-pay

## 2021-04-07 NOTE — Telephone Encounter (Addendum)
@   58- Dr Bobby Rumpf has left for the day. I asked [name],NP, to check the scan results. There are no brain lesions. There is some fluid present, most likely sinusitis. I called pt's dtr back and notified her of above. She was relieved.   @  1336 -  I called Stephanie Garza and talked with her regarding above. She asked if he could look at her scan of the brain. She states, "She has been hallucinating bad, like she is way out there. I'm just worried if the disease has spread to her brain. I know small cell does that. If you will please ask him and call me back today, I would appreciate it. I'm in the hospital at [facility] myself or I would be up there with her".  @ 1200- Dr Bobby Rumpf states he looked at the scans on Friday, and he isn't concerned with the area in the lung.   @1010   - Pt's dtr, Stephanie Garza, LVM on triage line, asking if Dr Bobby Rumpf could review the scan results that have been done since hospitalization. Stephanie Garza was told that the scan showed another area in the lung. She states her mom has been in the hospital for a week. [name],NP, states that Dr Bobby Rumpf stopped by to see pt on Friday, as a courtesy only. Pt was admitted with CHF.

## 2021-04-08 NOTE — Progress Notes (Signed)
[facility]  [address] [place],  [place]  [address] [phone]  Clinic Day:  04/12/2021  Referring physician: Gardiner Rhyme, MD  This document serves as a record of services personally performed by Selicia Windom Macarthur Critchley, MD. It was created on their behalf by [facility], a trained medical scribe. The creation of this record is based on the scribe's personal observations and the provider's statements to them.  HISTORY OF PRESENT ILLNESS:  The patient is a 80 y.o. female with limited stage small cell lung cancer.  The patient was only able to receive 1 cycle of carboplatin/etoposide before her health abruptly declined.  She was just discharged from the hospital again last week due to a COPD exacerbation.  Of note, she was still getting radiation after her chemotherapy was held, but this has also been very sporadic due to her recent hospitalizations.  She comes in today to reassess her clinical status.  She admits her baseline health remains poor.  Essentially, all of her immediate family is sick as well, which prevents them from helping her get through her medical issues.  Her ability to perform any significant activities of daily living is becoming very limited.  PHYSICAL EXAM:  Blood pressure (!) 144/74, pulse 91, temperature 97.7 F (36.5 C), temperature source Oral, resp. rate 20, height 5\' 5"  (1.651 m), SpO2 98 %. Wt Readings from Last 3 Encounters:  03/22/21 164 lb 8 oz (74.6 kg)  03/01/21 172 lb (78 kg)  02/26/21 172 lb (78 kg)   Body mass index is 27.37 kg/m. Performance status (ECOG): 3 Physical Exam Constitutional:      Appearance: Normal appearance. She is ill-appearing.     Comments: In a wheelchair; wearing oxygen per nasal canula  HENT:     Mouth/Throat:     Mouth: Mucous membranes are moist.     Pharynx: Oropharynx is clear. No oropharyngeal exudate or posterior oropharyngeal erythema.  Cardiovascular:     Rate and Rhythm: Normal  rate and regular rhythm.     Heart sounds: No murmur heard.   No friction rub. No gallop.  Pulmonary:     Effort: Pulmonary effort is normal. No respiratory distress.     Breath sounds: Normal breath sounds. No wheezing, rhonchi or rales.     Comments: Mildly decreased breath sounds bilaterally Chest:     Chest wall: No tenderness.  Breasts:    Right: No axillary adenopathy or supraclavicular adenopathy.     Left: No axillary adenopathy or supraclavicular adenopathy.  Abdominal:     General: Bowel sounds are normal. There is no distension.     Palpations: Abdomen is soft. There is no mass.     Tenderness: There is no abdominal tenderness.  Musculoskeletal:        General: No swelling.     Right lower leg: No edema.     Left lower leg: No edema.  Lymphadenopathy:     Cervical: No cervical adenopathy.     Upper Body:     Right upper body: No supraclavicular or axillary adenopathy.     Left upper body: No supraclavicular or axillary adenopathy.     Lower Body: No right inguinal adenopathy. No left inguinal adenopathy.  Skin:    General: Skin is warm.     Coloration: Skin is not jaundiced.     Findings: No lesion or rash.  Neurological:     General: No focal deficit present.     Mental Status: She  is alert and oriented to person, place, and time. Mental status is at baseline.     Cranial Nerves: Cranial nerves are intact.  Psychiatric:        Mood and Affect: Mood normal.        Behavior: Behavior normal.        Thought Content: Thought content normal.   LABS:   Ref. Range 04/12/2021 00:00  Sodium Latest Ref Range: 137 - 147  134  Potassium Latest Ref Range: 3.4 - 5.3  3.7  Chloride Latest Ref Range: 99 - 108  93 (A)  CO2 Latest Ref Range: 13 - 22  33  Glucose Unknown 161  BUN Latest Ref Range: 4 - 21  17  Creatinine Latest Ref Range: 0.5 - 1.1  0.7  Calcium Latest Ref Range: 8.7 - 10.7  8.1 (A)  Alkaline Phosphatase Latest Ref Range: 25 - 125  84  Albumin Latest Ref  Range: 3.5 - 5.0  3.4  AST Latest Ref Range: 13 - 35  19  ALT Latest Ref Range: 7 - 35  22  Bilirubin, Total Unknown 0.5  WBC Unknown 18.4 (A)  RBC Latest Ref Range: 3.87 - 5.11  3.36 (A)  Hemoglobin Latest Ref Range: 12.0 - 16.0  9.9 (A)  HCT Latest Ref Range: 36 - 46  30 (A)  Platelets Latest Ref Range: 150 - 399  150  NEUT# Unknown Canceled    ASSESSMENT & PLAN:  A 80 y.o. female with limited stage small cell lung cancer.  The patient's health remains very poor.  Any thoughts of entertaining more chemotherapy are gone.   I also anticipate radiation oncology will forego giving her additional days of radiation.  Due to her failing health, I will have home health nursing evaluate her and determine what services and supplies she needs to hopefully get her better over time.  With respect to diffuse body pain, I will increase her oxycodone to 15 mg every 6 hours.  As her anxiety remains prominent, I will increase her Ativan to 1.5 mg every 6 hours.  With respect to her lung cancer surveillance, I will see her back in 2 months, with CT scans being done a day before that visit to ascertain her new disease baseline.  The patient understands all the plans discussed today and is in agreement with them.   I, Rita Ohara, am acting as scribe for Marice Potter, MD    I have reviewed this report as typed by the medical scribe, and it is complete and accurate.  Aleigha Gilani Macarthur Critchley, MD

## 2021-04-09 NOTE — Progress Notes (Signed)
Sent in request for DOS 07/18, 07/19, 07/20, 07/21, and 07/22 to [employer].

## 2021-04-12 ENCOUNTER — Encounter: Payer: Self-pay | Admitting: Oncology

## 2021-04-12 ENCOUNTER — Telehealth: Payer: Self-pay | Admitting: Family

## 2021-04-12 ENCOUNTER — Other Ambulatory Visit: Payer: Self-pay

## 2021-04-12 ENCOUNTER — Inpatient Hospital Stay: Payer: Medicare HMO | Attending: Oncology | Admitting: Oncology

## 2021-04-12 ENCOUNTER — Inpatient Hospital Stay: Payer: Medicare HMO

## 2021-04-12 ENCOUNTER — Other Ambulatory Visit: Payer: Self-pay | Admitting: Oncology

## 2021-04-12 VITALS — BP 144/74 | HR 91 | Temp 97.7°F | Resp 20 | Ht 65.0 in

## 2021-04-12 DIAGNOSIS — C3491 Malignant neoplasm of unspecified part of right bronchus or lung: Secondary | ICD-10-CM

## 2021-04-12 LAB — BASIC METABOLIC PANEL
BUN: 17 (ref 4–21)
CO2: 33 — AB (ref 13–22)
Chloride: 93 — AB (ref 99–108)
Creatinine: 0.7 (ref 0.5–1.1)
Glucose: 161
Potassium: 3.7 (ref 3.4–5.3)
Sodium: 134 — AB (ref 137–147)

## 2021-04-12 LAB — HEPATIC FUNCTION PANEL
ALT: 22 (ref 7–35)
AST: 19 (ref 13–35)
Alkaline Phosphatase: 84 (ref 25–125)
Bilirubin, Total: 0.5

## 2021-04-12 LAB — CBC AND DIFFERENTIAL
HCT: 30 — AB (ref 36–46)
Hemoglobin: 9.9 — AB (ref 12.0–16.0)
Platelets: 150 (ref 150–399)
WBC: 18.4

## 2021-04-12 LAB — COMPREHENSIVE METABOLIC PANEL
Albumin: 3.4 — AB (ref 3.5–5.0)
Calcium: 8.1 — AB (ref 8.7–10.7)

## 2021-04-12 LAB — CBC: RBC: 3.36 — AB (ref 3.87–5.11)

## 2021-04-12 MED ORDER — LORAZEPAM 1 MG PO TABS: 1.5000 mg | ORAL_TABLET | Freq: Three times a day (TID) | ORAL | 0 refills | Status: DC | PRN

## 2021-04-12 MED ORDER — OXYCODONE HCL 15 MG PO TABA: 15.0000 mg | ORAL_TABLET | Freq: Four times a day (QID) | ORAL | 0 refills | Status: DC | PRN

## 2021-04-12 NOTE — Telephone Encounter (Signed)
   Stephanie Garza DOB: [birth year] MRN: [id number]   RIDER WAIVER AND RELEASE OF LIABILITY  For purposes of improving physical access to our facilities, [facility] is pleased to partner with third parties to provide [facility] patients or other authorized individuals the option of convenient, on-demand ground transportation services (the [place]") through use of the technology service that enables users to request on-demand ground transportation from independent third-party providers.  By opting to use and accept these [employer], I, the undersigned, hereby agree on behalf of myself, and on behalf of any minor child using the Government social research officer for whom I am the parent or legal guardian, as follows:  Government social research officer provided to me are provided by independent third-party transportation providers who are not [employer] or employees and who are unaffiliated with [employer]. [place] is neither a transportation carrier nor a common or public carrier. [facility] has no control over the quality or safety of the transportation that occurs as a result of the [employer]. [facility] cannot guarantee that any third-party transportation provider will complete any arranged transportation service. [facility] makes no representation, warranty, or guarantee regarding the reliability, timeliness, quality, safety, suitability, or availability of any of the Transport Services or that they will be error free. I fully understand that traveling by vehicle involves risks and dangers of serious bodily injury, including permanent disability, paralysis, and death. I agree, on behalf of myself and on behalf of any minor child using the Transport Services for whom I am the parent or legal guardian, that the entire risk arising out of my use of the [employer] remains solely with me, to the maximum extent permitted under applicable law. The [employer] are provided "as  is" and "as available." [facility] disclaims all representations and warranties, express, implied or statutory, not expressly set out in these terms, including the implied warranties of merchantability and fitness for a particular purpose. I hereby waive and release [facility], its agents, employees, officers, directors, representatives, insurers, attorneys, assigns, successors, subsidiaries, and affiliates from any and all past, present, or future claims, demands, liabilities, actions, causes of action, or suits of any kind directly or indirectly arising from acceptance and use of the [employer]. I further waive and release [facility] and its affiliates from all present and future liability and responsibility for any injury or death to persons or damages to property caused by or related to the use of the [employer]. I have read this Waiver and Release of Liability, and I understand the terms used in it and their legal significance. This Waiver is freely and voluntarily given with the understanding that my right (as well as the right of any minor child for whom I am the parent or legal guardian using the [employer]) to legal recourse against [place] in connection with the [employer] is knowingly surrendered in return for use of these services.   I attest that I read the consent document to Stephanie Garza, gave Stephanie Garza the opportunity to ask questions and answered the questions asked (if any). I affirm that Stephanie Garza then provided consent for she's participation in this program.     Darrick Meigs Vilsaint

## 2021-04-13 NOTE — Progress Notes (Signed)
Sent in request for DOS 07/25, 07/26, 07/27, 07/28, and 07/29 to [employer].

## 2021-04-14 ENCOUNTER — Telehealth: Payer: Self-pay

## 2021-04-14 ENCOUNTER — Other Ambulatory Visit: Payer: Self-pay | Admitting: Oncology

## 2021-04-14 MED ORDER — LORAZEPAM 1 MG PO TABS: 1.5000 mg | ORAL_TABLET | Freq: Three times a day (TID) | ORAL | 1 refills | Status: DC

## 2021-04-14 NOTE — Telephone Encounter (Signed)
I attempted call to notify Stephanie Garza that Dr Bobby Rumpf sent in prescription again for the increase in Ativan (for some reason the first Rx didn't go through). However, no answer.    Dr Bobby Rumpf sent in new eRx for Ativan w/ increase dosage.    Pt's daughter,Stephanie Garza, called to inquire about the new eRx for Ativan. Dr Bobby Rumpf had told the, he would send over prescription increasing her Ativan to 1.5 tabs.

## 2021-04-15 ENCOUNTER — Other Ambulatory Visit: Payer: Self-pay | Admitting: Oncology

## 2021-04-15 ENCOUNTER — Telehealth: Payer: Self-pay

## 2021-04-15 MED ORDER — DIPHENOXYLATE-ATROPINE 2.5-0.025 MG PO TABS: 2.0000 | ORAL_TABLET | Freq: Four times a day (QID) | ORAL | 0 refills | Status: DC | PRN

## 2021-04-15 NOTE — Telephone Encounter (Signed)
Pt's daughter,Faith, called to make sure her mom gets her radiation appointments done per her mom's requests. She states , "we have had hard time getting her here because of the diarrhea. She cant leave home hardly without messing herself". She also mentioned she had called [facility] to help with transportation to and from visits. I transferred the call to [place] t& Evelena Peat for f/u.

## 2021-04-19 ENCOUNTER — Encounter: Payer: Self-pay | Admitting: Oncology

## 2021-04-20 NOTE — Progress Notes (Signed)
Sent in request for DOS 08/01, 08/02, 08/03, 08/04, and 08/05 to [employer].

## 2021-04-26 ENCOUNTER — Other Ambulatory Visit: Payer: Self-pay | Admitting: Hematology and Oncology

## 2021-04-26 MED ORDER — OXYCODONE HCL 15 MG PO TABA: 15.0000 mg | ORAL_TABLET | Freq: Four times a day (QID) | ORAL | 0 refills | Status: DC | PRN

## 2021-04-26 MED ORDER — DIPHENOXYLATE-ATROPINE 2.5-0.025 MG PO TABS: 2.0000 | ORAL_TABLET | Freq: Four times a day (QID) | ORAL | 0 refills | Status: AC | PRN

## 2021-04-27 NOTE — Progress Notes (Signed)
Sent in request for DOS 08/08 - 08/12 to [employer].

## 2021-05-04 ENCOUNTER — Other Ambulatory Visit: Payer: Self-pay | Admitting: Hematology and Oncology

## 2021-05-04 MED ORDER — NITROFURANTOIN MONOHYD MACRO 100 MG PO CAPS: 100.0000 mg | ORAL_CAPSULE | Freq: Two times a day (BID) | ORAL | 0 refills | Status: DC

## 2021-05-04 NOTE — Progress Notes (Signed)
Sent in request for DOS 08/15-08/19 and 08/22-08/26, to [employer].

## 2021-05-10 ENCOUNTER — Other Ambulatory Visit: Payer: Self-pay | Admitting: Hematology and Oncology

## 2021-05-10 MED ORDER — LORAZEPAM 1 MG PO TABS: 1.5000 mg | ORAL_TABLET | Freq: Three times a day (TID) | ORAL | 1 refills | Status: DC

## 2021-05-10 MED ORDER — OXYCODONE HCL 15 MG PO TABA: 15.0000 mg | ORAL_TABLET | Freq: Four times a day (QID) | ORAL | 0 refills | Status: DC | PRN

## 2021-05-12 ENCOUNTER — Other Ambulatory Visit: Payer: Self-pay | Admitting: Hematology and Oncology

## 2021-05-12 MED ORDER — OXYCODONE HCL 15 MG PO TABS: 15.0000 mg | ORAL_TABLET | Freq: Four times a day (QID) | ORAL | 0 refills | Status: DC | PRN

## 2021-05-20 ENCOUNTER — Inpatient Hospital Stay (HOSPITAL_COMMUNITY)
Admission: EM | Admit: 2021-05-20 | Discharge: 2021-05-25 | DRG: 190 | Disposition: A | Discharge status: Home Health | Payer: Medicare HMO | Attending: Internal Medicine | Admitting: Internal Medicine

## 2021-05-20 ENCOUNTER — Emergency Department (HOSPITAL_COMMUNITY): Payer: Medicare HMO

## 2021-05-20 DIAGNOSIS — Z801 Family history of malignant neoplasm of trachea, bronchus and lung: Secondary | ICD-10-CM

## 2021-05-20 DIAGNOSIS — Z7951 Long term (current) use of inhaled steroids: Secondary | ICD-10-CM

## 2021-05-20 DIAGNOSIS — Z66 Do not resuscitate: Secondary | ICD-10-CM | POA: Diagnosis present

## 2021-05-20 DIAGNOSIS — M797 Fibromyalgia: Secondary | ICD-10-CM | POA: Diagnosis present

## 2021-05-20 DIAGNOSIS — C3491 Malignant neoplasm of unspecified part of right bronchus or lung: Secondary | ICD-10-CM | POA: Diagnosis present

## 2021-05-20 DIAGNOSIS — Z9221 Personal history of antineoplastic chemotherapy: Secondary | ICD-10-CM

## 2021-05-20 DIAGNOSIS — Z803 Family history of malignant neoplasm of breast: Secondary | ICD-10-CM

## 2021-05-20 DIAGNOSIS — G894 Chronic pain syndrome: Secondary | ICD-10-CM | POA: Diagnosis present

## 2021-05-20 DIAGNOSIS — Z20822 Contact with and (suspected) exposure to covid-19: Secondary | ICD-10-CM | POA: Diagnosis present

## 2021-05-20 DIAGNOSIS — J441 Chronic obstructive pulmonary disease with (acute) exacerbation: Secondary | ICD-10-CM | POA: Diagnosis not present

## 2021-05-20 DIAGNOSIS — F419 Anxiety disorder, unspecified: Secondary | ICD-10-CM | POA: Diagnosis present

## 2021-05-20 DIAGNOSIS — Z800 Family history of malignant neoplasm of digestive organs: Secondary | ICD-10-CM

## 2021-05-20 DIAGNOSIS — Z79899 Other long term (current) drug therapy: Secondary | ICD-10-CM

## 2021-05-20 DIAGNOSIS — Z8249 Family history of ischemic heart disease and other diseases of the circulatory system: Secondary | ICD-10-CM

## 2021-05-20 DIAGNOSIS — E43 Unspecified severe protein-calorie malnutrition: Secondary | ICD-10-CM | POA: Insufficient documentation

## 2021-05-20 DIAGNOSIS — J9621 Acute and chronic respiratory failure with hypoxia: Secondary | ICD-10-CM | POA: Diagnosis present

## 2021-05-20 DIAGNOSIS — J9601 Acute respiratory failure with hypoxia: Secondary | ICD-10-CM | POA: Diagnosis present

## 2021-05-20 DIAGNOSIS — Z7982 Long term (current) use of aspirin: Secondary | ICD-10-CM

## 2021-05-20 DIAGNOSIS — D539 Nutritional anemia, unspecified: Secondary | ICD-10-CM | POA: Diagnosis present

## 2021-05-20 DIAGNOSIS — R627 Adult failure to thrive: Secondary | ICD-10-CM | POA: Diagnosis present

## 2021-05-20 DIAGNOSIS — Z6826 Body mass index (BMI) 26.0-26.9, adult: Secondary | ICD-10-CM

## 2021-05-20 DIAGNOSIS — E538 Deficiency of other specified B group vitamins: Secondary | ICD-10-CM | POA: Diagnosis present

## 2021-05-20 DIAGNOSIS — Z87891 Personal history of nicotine dependence: Secondary | ICD-10-CM

## 2021-05-20 DIAGNOSIS — I5032 Chronic diastolic (congestive) heart failure: Secondary | ICD-10-CM | POA: Diagnosis present

## 2021-05-20 DIAGNOSIS — Z808 Family history of malignant neoplasm of other organs or systems: Secondary | ICD-10-CM

## 2021-05-20 DIAGNOSIS — F32A Depression, unspecified: Secondary | ICD-10-CM | POA: Diagnosis present

## 2021-05-20 DIAGNOSIS — Z9981 Dependence on supplemental oxygen: Secondary | ICD-10-CM

## 2021-05-20 DIAGNOSIS — M109 Gout, unspecified: Secondary | ICD-10-CM | POA: Diagnosis present

## 2021-05-20 DIAGNOSIS — K219 Gastro-esophageal reflux disease without esophagitis: Secondary | ICD-10-CM | POA: Diagnosis present

## 2021-05-20 LAB — I-STAT ARTERIAL BLOOD GAS, ED
Acid-Base Excess: 8 mmol/L — ABNORMAL HIGH (ref 0.0–2.0)
Bicarbonate: 33.3 mmol/L — ABNORMAL HIGH (ref 20.0–28.0)
Calcium, Ion: 1.22 mmol/L (ref 1.15–1.40)
HCT: 22 % — ABNORMAL LOW (ref 36.0–46.0)
Hemoglobin: 7.5 g/dL — ABNORMAL LOW (ref 12.0–15.0)
O2 Saturation: 99 %
Patient temperature: 98
Potassium: 4.2 mmol/L (ref 3.5–5.1)
Sodium: 139 mmol/L (ref 135–145)
TCO2: 35 mmol/L — ABNORMAL HIGH (ref 22–32)
pCO2 arterial: 53.4 mmHg — ABNORMAL HIGH (ref 32.0–48.0)
pH, Arterial: 7.401 (ref 7.350–7.450)
pO2, Arterial: 136 mmHg — ABNORMAL HIGH (ref 83.0–108.0)

## 2021-05-20 NOTE — ED Notes (Signed)
POA/daughter Kyra Searles [phone] would like a call back immediately and an update

## 2021-05-20 NOTE — ED Provider Notes (Signed)
[facility] EMERGENCY DEPARTMENT Provider Note   CSN: [id number] Arrival date & time: 05/20/21  2237     History Chief Complaint  Patient presents with   Shortness of Breath    Stephanie Garza is a 80 y.o. female.  The history is provided by the patient and medical records.  Shortness of Breath Associated symptoms: cough    80 y.o. 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  Per EMS, usually on baseline 3 and has been using 4L at home recently.  She has been doing nebs all day today without relief.  She was given 2 additional nebs by EMS along with 125mg  solumedrol and eventually started on CPAP.  Patient reports some recent hospital admissions for COPD.  Denies fever or sick contacts.  Does have chronic cough that is unchanged.  Patient reports frequent admission to hospital for COPD, usually at [facility].    Past Medical History:  Diagnosis Date   Anemia    Anxiety    Arthritis    Asthma    COPD (chronic obstructive pulmonary disease) ([place])    Depression    Fibromyalgia    GERD (gastroesophageal reflux disease)    Gout    Lung cancer [facility])     Patient Active Problem List   Diagnosis Date Noted   Small cell carcinoma of lung, right ([place]) 02/19/2021   Abnormal gait 02/18/2021   Chronic diastolic heart failure ([place]) 02/18/2021   Chronic obstructive pulmonary disease ([place]) 02/18/2021   Chronic pain syndrome 02/18/2021   Essential tremor 02/18/2021   Gastro-esophageal reflux disease without esophagitis 02/18/2021   Generalized anxiety disorder 02/18/2021   Hereditary and idiopathic neuropathy, unspecified 02/18/2021   OBESITY, NOS 11/23/2006   ANXIETY 11/23/2006   TOBACCO DEPENDENCE 11/23/2006   DEPRESSIVE DISORDER, NOS 11/23/2006   CATARACT 11/23/2006   HYPERTENSION, BENIGN SYSTEMIC 11/23/2006   EDEMA-LEGS,DUE TO VENOUS OBSTRUCT. 11/23/2006   ASTHMA,  UNSPECIFIED 11/23/2006   ARTHRITIS 11/23/2006   INSOMNIA NOS 11/23/2006      OB History   No obstetric history on file.     Family History  Problem Relation Age of Onset   Breast cancer Mother    Colon cancer Father 21   Brain cancer Sister 50   Lung cancer Sister 57   Lupus Sister    Heart disease Brother    Breast cancer Daughter 6   Lung cancer Son 36   Skin cancer Niece 83    Social History   Tobacco Use   Smoking status: Former   Smokeless tobacco: Never  Scientific laboratory technician Use: Every day  Substance Use Topics   Alcohol use: Not Currently   Drug use: Not Currently    Home Medications Prior to Admission medications   Medication Sig Start Date End Date Taking? Authorizing Provider  albuterol (VENTOLIN HFA) 108 (90 Base) MCG/ACT inhaler  08/28/18   [provider]  aspirin 81 MG EC tablet     [provider]  budesonide-formoterol (SYMBICORT) 160-4.5 MCG/ACT inhaler     [provider]  buPROPion (WELLBUTRIN SR) 150 MG 12 hr tablet Take 2 tablets by mouth daily. 08/28/18   [provider]  dicyclomine (BENTYL) 20 MG tablet     [provider]  diphenoxylate-atropine (LOMOTIL) 2.5-0.025 MG tablet Take 2 tablets by mouth 4 (four) times daily as needed for diarrhea or loose stools (1-2 TABLETS  AFTER EACH LOOSE STOOL - MAXIMUM OF 10 DAILY). 04/26/21   Melodye Ped, NP  famotidine (PEPCID) 20 MG tablet     [provider]  fluticasone (FLONASE) 50 MCG/ACT nasal spray  08/18/18   [provider]  fluticasone-salmeterol (ADVAIR DISKUS) 500-50 MCG/ACT AEPB Take by mouth. 08/28/18   [provider]  furosemide (LASIX) 40 MG tablet Take by mouth. 08/18/18   [provider]  gabapentin (NEURONTIN) 600 MG tablet Take 1 tablet by mouth 3 (three) times daily. 08/28/18   [provider]  ipratropium-albuterol (DUONEB) 0.5-2.5 (3) MG/3ML SOLN  08/15/18   [provider]   levofloxacin (LEVAQUIN) 500 MG tablet  11/17/20   [provider]  LORazepam (ATIVAN) 1 MG tablet Take 1.5 tablets (1.5 mg total) by mouth every 8 (eight) hours. 05/10/21   Melodye Ped, NP  nitrofurantoin, macrocrystal-monohydrate, (MACROBID) 100 MG capsule Take 1 capsule (100 mg total) by mouth 2 (two) times daily. 05/04/21   Melodye Ped, NP  omeprazole (PRILOSEC) 40 MG capsule  08/18/18   [provider]  ondansetron (ZOFRAN) 8 MG tablet     [provider]  oxyCODONE (ROXICODONE) 15 MG immediate release tablet Take 1 tablet (15 mg total) by mouth every 6 (six) hours as needed for pain. 05/12/21   Dayton Scrape A, NP  potassium chloride SA (KLOR-CON M20) 20 MEQ tablet  08/18/18   [provider]  predniSONE (STERAPRED UNI-PAK 21 TAB) 5 MG (21) TBPK tablet  11/17/20   [provider]  primidone (MYSOLINE) 50 MG tablet     [provider]  prochlorperazine (COMPAZINE) 10 MG tablet Take 1 tablet (10 mg total) by mouth every 6 (six) hours as needed for nausea or vomiting. 02/25/21   Melodye Ped, NP  sertraline (ZOLOFT) 25 MG tablet     [provider]  spironolactone (ALDACTONE) 25 MG tablet     [provider]  tamsulosin (FLOMAX) 0.4 MG CAPS capsule     [provider]  tiotropium (SPIRIVA HANDIHALER) 18 MCG inhalation capsule  08/28/18   [provider]  Vitamin D, Ergocalciferol, (DRISDOL) 1.25 MG (50000 UNIT) CAPS capsule  05/03/19   [provider]    Allergies    Ceclor [cefaclor]  Review of Systems   Review of Systems  Respiratory:  Positive for cough and shortness of breath.   All other systems reviewed and are negative.  Physical Exam Updated Vital Signs BP (!) 118/54   Pulse 75   Temp 98 F (36.7 C)   Resp (!) 21   Ht 5\' 5"  (1.651 m)   Wt 71.7 kg   SpO2 99%   BMI 26.29 kg/m   Physical Exam Vitals and nursing note reviewed.  Constitutional:       Appearance: She is well-developed.     Comments: elderly  HENT:     Head: Normocephalic and atraumatic.  Eyes:     Conjunctiva/sclera: Conjunctivae normal.     Pupils: Pupils are equal, round, and reactive to light.  Cardiovascular:     Rate and Rhythm: Normal rate and regular rhythm.     Heart sounds: Normal heart sounds.  Pulmonary:     Effort: Tachypnea, accessory muscle usage, respiratory distress and retractions present.     Breath sounds: Wheezing and rhonchi present.     Comments: Intermixed wheezes and rhonchi, more pronounced in upper lobes, speaking in short, 2-3 word phrases, retractions noted Abdominal:  General: Bowel sounds are normal.     Palpations: Abdomen is soft.  Musculoskeletal:        General: Normal range of motion.     Cervical back: Normal range of motion.  Skin:    General: Skin is warm and dry.  Neurological:     Mental Status: She is alert and oriented to person, place, and time.    ED Results / Procedures / Treatments   Labs (all labs ordered are listed, but only abnormal results are displayed) Labs Reviewed  CBC WITH DIFFERENTIAL/PLATELET - Abnormal; Notable for the following components:      Result Value   RBC 2.44 (*)    Hemoglobin 7.6 (*)    HCT 24.9 (*)    MCV 102.0 (*)    All other components within normal limits  BASIC METABOLIC PANEL - Abnormal; Notable for the following components:   Calcium 8.6 (*)    All other components within normal limits  I-STAT ARTERIAL BLOOD GAS, ED - Abnormal; Notable for the following components:   pCO2 arterial 53.4 (*)    pO2, Arterial 136 (*)    Bicarbonate 33.3 (*)    TCO2 35 (*)    Acid-Base Excess 8.0 (*)    HCT 22.0 (*)    Hemoglobin 7.5 (*)    All other components within normal limits  RESP PANEL BY RT-PCR (FLU A&B, COVID) ARPGX2  BRAIN NATRIURETIC PEPTIDE  TROPONIN I (HIGH SENSITIVITY)  TROPONIN I (HIGH SENSITIVITY)    EKG None  Radiology DG Chest Port 1 View  Result Date:  05/20/2021 CLINICAL DATA:  Shortness of breath EXAM: PORTABLE CHEST 1 VIEW COMPARISON:  05/11/2021 FINDINGS: Right Port-A-Cath remains in place, unchanged. Scarring at the left base. Right lung clear. Heart is normal size. No effusions or acute bony abnormality. IMPRESSION: Left basilar scarring. No active disease. Electronically Signed   By: Rolm Baptise M.D.   On: 05/20/2021 22:53    Procedures Procedures   CRITICAL CARE Performed by: Larene Pickett   Total critical care time: 45 minutes  Critical care time was exclusive of separately billable procedures and treating other patients.  Critical care was necessary to treat or prevent imminent or life-threatening deterioration.  Critical care was time spent personally by me on the following activities: development of treatment plan with patient and/or surrogate as well as nursing, discussions with consultants, evaluation of patient's response to treatment, examination of patient, obtaining history from patient or surrogate, ordering and performing treatments and interventions, ordering and review of laboratory studies, ordering and review of radiographic studies, pulse oximetry and re-evaluation of patient's condition.  Medications Ordered in ED Medications - No data to display  ED Course  I have reviewed the triage vital signs and the nursing notes.  Pertinent labs & imaging results that were available during my care of the patient were reviewed by me and considered in my medical decision making (see chart for details).    MDM Rules/Calculators/A&P                           80 y.o. F presenting to the ED with shortness of breath over the past 2 days.  Has significant history of COPD, on chronic oxygen.  Has been doing nebs all day today and using increased oxygen from baseline without much change.  Given additional nebs and 125 mg Solu-Medrol with EMS, ultimately requiring CPAP.  She was transition to BiPAP on arrival to  ED and is  tolerating well.  Does have some noted intermixed wheezes and rhonchi, more pronounced in upper lobes.  She is able to speak, but only in 2-3 word phrases.  She does have accessory muscle use and retractions.  Work-up is pending including labs, chest x-ray, blood gas.  Consider PE given her history of lung cancer, however with her wheezing I favor more COPD clinical picture.  12:50 AM Appears improved after some time on bipap.  Work-up as above--hemoglobin is down from last on record, Hemoccult is negative.  Chest x-ray without any acute findings, some chronic scarring at left base.  Covid screen is negative.  Will admit for COPD exacerbation.  Discussed with Dr. Hal Hope-- will admit for ongoing care.  Final Clinical Impression(s) / ED Diagnoses Final diagnoses:  COPD exacerbation [facility])    Rx / DC Orders ED Discharge Orders     None        Larene Pickett, PA-C 05/21/21 0114    Merrily Pew, MD 05/21/21 0330

## 2021-05-20 NOTE — ED Triage Notes (Signed)
Pt from home c/o SOB x2 days that worsened tonight. Pt using home neb treatments with no relief - last treatment 1830. Pt normally on 3L home o2. Pt arrives on CPAP. Hx of lung cancer and COPD. 2.5 albuterol and 125mg  solu med given PTA.   BP: 163/99 Spo2: 99%  HR: 90

## 2021-05-21 ENCOUNTER — Encounter (HOSPITAL_COMMUNITY): Payer: Self-pay | Admitting: Internal Medicine

## 2021-05-21 DIAGNOSIS — F419 Anxiety disorder, unspecified: Secondary | ICD-10-CM | POA: Diagnosis present

## 2021-05-21 DIAGNOSIS — J9621 Acute and chronic respiratory failure with hypoxia: Secondary | ICD-10-CM | POA: Diagnosis present

## 2021-05-21 DIAGNOSIS — K219 Gastro-esophageal reflux disease without esophagitis: Secondary | ICD-10-CM | POA: Diagnosis present

## 2021-05-21 DIAGNOSIS — D539 Nutritional anemia, unspecified: Secondary | ICD-10-CM | POA: Diagnosis present

## 2021-05-21 DIAGNOSIS — M797 Fibromyalgia: Secondary | ICD-10-CM | POA: Diagnosis present

## 2021-05-21 DIAGNOSIS — Z7951 Long term (current) use of inhaled steroids: Secondary | ICD-10-CM | POA: Diagnosis not present

## 2021-05-21 DIAGNOSIS — E43 Unspecified severe protein-calorie malnutrition: Secondary | ICD-10-CM | POA: Diagnosis present

## 2021-05-21 DIAGNOSIS — Z87891 Personal history of nicotine dependence: Secondary | ICD-10-CM | POA: Diagnosis not present

## 2021-05-21 DIAGNOSIS — Z7982 Long term (current) use of aspirin: Secondary | ICD-10-CM | POA: Diagnosis not present

## 2021-05-21 DIAGNOSIS — E538 Deficiency of other specified B group vitamins: Secondary | ICD-10-CM | POA: Diagnosis present

## 2021-05-21 DIAGNOSIS — R627 Adult failure to thrive: Secondary | ICD-10-CM | POA: Diagnosis present

## 2021-05-21 DIAGNOSIS — I5032 Chronic diastolic (congestive) heart failure: Secondary | ICD-10-CM | POA: Diagnosis present

## 2021-05-21 DIAGNOSIS — Z803 Family history of malignant neoplasm of breast: Secondary | ICD-10-CM | POA: Diagnosis not present

## 2021-05-21 DIAGNOSIS — C3491 Malignant neoplasm of unspecified part of right bronchus or lung: Secondary | ICD-10-CM | POA: Diagnosis present

## 2021-05-21 DIAGNOSIS — J441 Chronic obstructive pulmonary disease with (acute) exacerbation: Secondary | ICD-10-CM | POA: Diagnosis present

## 2021-05-21 DIAGNOSIS — G894 Chronic pain syndrome: Secondary | ICD-10-CM | POA: Diagnosis present

## 2021-05-21 DIAGNOSIS — Z9981 Dependence on supplemental oxygen: Secondary | ICD-10-CM | POA: Diagnosis not present

## 2021-05-21 DIAGNOSIS — Z79899 Other long term (current) drug therapy: Secondary | ICD-10-CM | POA: Diagnosis not present

## 2021-05-21 DIAGNOSIS — Z801 Family history of malignant neoplasm of trachea, bronchus and lung: Secondary | ICD-10-CM | POA: Diagnosis not present

## 2021-05-21 DIAGNOSIS — M109 Gout, unspecified: Secondary | ICD-10-CM | POA: Diagnosis present

## 2021-05-21 DIAGNOSIS — J9601 Acute respiratory failure with hypoxia: Secondary | ICD-10-CM | POA: Diagnosis present

## 2021-05-21 DIAGNOSIS — Z20822 Contact with and (suspected) exposure to covid-19: Secondary | ICD-10-CM | POA: Diagnosis present

## 2021-05-21 DIAGNOSIS — Z66 Do not resuscitate: Secondary | ICD-10-CM | POA: Diagnosis present

## 2021-05-21 DIAGNOSIS — F32A Depression, unspecified: Secondary | ICD-10-CM | POA: Diagnosis present

## 2021-05-21 DIAGNOSIS — Z800 Family history of malignant neoplasm of digestive organs: Secondary | ICD-10-CM | POA: Diagnosis not present

## 2021-05-21 LAB — CBC
HCT: 25.7 % — ABNORMAL LOW (ref 36.0–46.0)
Hemoglobin: 7.7 g/dL — ABNORMAL LOW (ref 12.0–15.0)
MCH: 30.9 pg (ref 26.0–34.0)
MCHC: 30 g/dL (ref 30.0–36.0)
MCV: 103.2 fL — ABNORMAL HIGH (ref 80.0–100.0)
Platelets: 144 10*3/uL — ABNORMAL LOW (ref 150–400)
RBC: 2.49 MIL/uL — ABNORMAL LOW (ref 3.87–5.11)
RDW: 15 % (ref 11.5–15.5)
WBC: 4.8 10*3/uL (ref 4.0–10.5)
nRBC: 0 % (ref 0.0–0.2)

## 2021-05-21 LAB — BASIC METABOLIC PANEL
Anion gap: 7 (ref 5–15)
Anion gap: 9 (ref 5–15)
BUN: 21 mg/dL (ref 8–23)
BUN: 17 mg/dL (ref 8–23)
CO2: 28 mmol/L (ref 22–32)
CO2: 27 mmol/L (ref 22–32)
Calcium: 8.6 mg/dL — ABNORMAL LOW (ref 8.9–10.3)
Calcium: 8.8 mg/dL — ABNORMAL LOW (ref 8.9–10.3)
Chloride: 104 mmol/L (ref 98–111)
Chloride: 102 mmol/L (ref 98–111)
Creatinine, Ser: 0.96 mg/dL (ref 0.44–1.00)
Creatinine, Ser: 0.9 mg/dL (ref 0.44–1.00)
GFR, Estimated: >60 mL/min (ref >60)
GFR, Estimated: >60 mL/min (ref >60)
Glucose, Bld: 92 mg/dL (ref 70–99)
Glucose, Bld: 133 mg/dL — ABNORMAL HIGH (ref 70–99)
Potassium: 4.5 mmol/L (ref 3.5–5.1)
Potassium: 4.5 mmol/L (ref 3.5–5.1)
Sodium: 139 mmol/L (ref 135–145)
Sodium: 138 mmol/L (ref 135–145)

## 2021-05-21 LAB — CBC WITH DIFFERENTIAL/PLATELET
Abs Immature Granulocytes: 0.01 10*3/uL (ref 0.00–0.07)
Basophils Absolute: 0 10*3/uL (ref 0.0–0.1)
Basophils Relative: 1 %
Eosinophils Absolute: 0.2 10*3/uL (ref 0.0–0.5)
Eosinophils Relative: 4 %
HCT: 24.9 % — ABNORMAL LOW (ref 36.0–46.0)
Hemoglobin: 7.6 g/dL — ABNORMAL LOW (ref 12.0–15.0)
Immature Granulocytes: 0 %
Lymphocytes Relative: 15 %
Lymphs Abs: 0.8 10*3/uL (ref 0.7–4.0)
MCH: 31.1 pg (ref 26.0–34.0)
MCHC: 30.5 g/dL (ref 30.0–36.0)
MCV: 102 fL — ABNORMAL HIGH (ref 80.0–100.0)
Monocytes Absolute: 0.4 10*3/uL (ref 0.1–1.0)
Monocytes Relative: 8 %
Neutro Abs: 3.8 10*3/uL (ref 1.7–7.7)
Neutrophils Relative %: 72 %
Platelets: 179 10*3/uL (ref 150–400)
RBC: 2.44 MIL/uL — ABNORMAL LOW (ref 3.87–5.11)
RDW: 15.1 % (ref 11.5–15.5)
WBC: 5.3 10*3/uL (ref 4.0–10.5)
nRBC: 0 % (ref 0.0–0.2)

## 2021-05-21 LAB — TROPONIN I (HIGH SENSITIVITY)
Troponin I (High Sensitivity): 5 ng/L (ref <18)
Troponin I (High Sensitivity): 5 ng/L (ref <18)

## 2021-05-21 LAB — POC OCCULT BLOOD, ED: Fecal Occult Bld: NEGATIVE

## 2021-05-21 LAB — RESP PANEL BY RT-PCR (FLU A&B, COVID) ARPGX2
Influenza A by PCR: NEGATIVE
Influenza B by PCR: NEGATIVE
SARS Coronavirus 2 by RT PCR: NEGATIVE

## 2021-05-21 LAB — BRAIN NATRIURETIC PEPTIDE: B Natriuretic Peptide: 151.6 pg/mL — ABNORMAL HIGH (ref 0.0–100.0)

## 2021-05-21 MED ORDER — DICYCLOMINE HCL 20 MG PO TABS
20.0000 mg | ORAL_TABLET | Freq: Two times a day (BID) | ORAL | Status: DC | PRN
  Administered 2021-05-21: 20 mg via ORAL
  Filled 2021-05-21 (×2): qty 1

## 2021-05-21 MED ORDER — IPRATROPIUM-ALBUTEROL 0.5-2.5 (3) MG/3ML IN SOLN
3.0000 mL | Freq: Three times a day (TID) | RESPIRATORY_TRACT | Status: DC
  Administered 2021-05-22 – 2021-05-25 (×9): 3 mL via RESPIRATORY_TRACT
  Filled 2021-05-21 (×9): qty 3

## 2021-05-21 MED ORDER — LORAZEPAM 1 MG PO TABS
1.5000 mg | ORAL_TABLET | Freq: Three times a day (TID) | ORAL | Status: DC | PRN
  Administered 2021-05-21 – 2021-05-22 (×3): 1.5 mg via ORAL
  Filled 2021-05-21: qty 1
  Filled 2021-05-21: qty 2
  Filled 2021-05-21: qty 1

## 2021-05-21 MED ORDER — ACETAMINOPHEN 325 MG PO TABS
650.0000 mg | ORAL_TABLET | Freq: Four times a day (QID) | ORAL | Status: DC | PRN
  Administered 2021-05-24 – 2021-05-25 (×2): 650 mg via ORAL
  Filled 2021-05-21 (×2): qty 2

## 2021-05-21 MED ORDER — ENOXAPARIN SODIUM 40 MG/0.4ML IJ SOSY
40.0000 mg | PREFILLED_SYRINGE | INTRAMUSCULAR | Status: DC
  Administered 2021-05-21 – 2021-05-22 (×2): 40 mg via SUBCUTANEOUS
  Filled 2021-05-21 (×2): qty 0.4

## 2021-05-21 MED ORDER — SPIRONOLACTONE 25 MG PO TABS
25.0000 mg | ORAL_TABLET | Freq: Every day | ORAL | Status: DC
  Administered 2021-05-21 – 2021-05-25 (×5): 25 mg via ORAL
  Filled 2021-05-21 (×6): qty 1

## 2021-05-21 MED ORDER — FUROSEMIDE 20 MG PO TABS
20.0000 mg | ORAL_TABLET | Freq: Every day | ORAL | Status: DC
  Administered 2021-05-21 – 2021-05-25 (×5): 20 mg via ORAL
  Filled 2021-05-21 (×5): qty 1

## 2021-05-21 MED ORDER — PRIMIDONE 50 MG PO TABS
50.0000 mg | ORAL_TABLET | Freq: Every day | ORAL | Status: DC
  Administered 2021-05-21 – 2021-05-24 (×4): 50 mg via ORAL
  Filled 2021-05-21 (×6): qty 1

## 2021-05-21 MED ORDER — ASPIRIN EC 81 MG PO TBEC
81.0000 mg | DELAYED_RELEASE_TABLET | Freq: Every day | ORAL | Status: DC
  Administered 2021-05-21 – 2021-05-25 (×5): 81 mg via ORAL
  Filled 2021-05-21 (×5): qty 1

## 2021-05-21 MED ORDER — ACETAMINOPHEN 650 MG RE SUPP: 650.0000 mg | Freq: Four times a day (QID) | RECTAL | Status: DC | PRN

## 2021-05-21 MED ORDER — BUDESONIDE 0.25 MG/2ML IN SUSP
0.2500 mg | Freq: Two times a day (BID) | RESPIRATORY_TRACT | Status: DC
  Administered 2021-05-21 – 2021-05-25 (×8): 0.25 mg via RESPIRATORY_TRACT
  Filled 2021-05-21 (×8): qty 2

## 2021-05-21 MED ORDER — AZITHROMYCIN 250 MG PO TABS
500.0000 mg | ORAL_TABLET | Freq: Every day | ORAL | Status: AC
  Administered 2021-05-22 – 2021-05-25 (×4): 500 mg via ORAL
  Filled 2021-05-21 (×5): qty 2

## 2021-05-21 MED ORDER — MORPHINE SULFATE (PF) 2 MG/ML IV SOLN
1.0000 mg | Freq: Once | INTRAVENOUS | Status: AC
  Administered 2021-05-21: 1 mg via INTRAVENOUS
  Filled 2021-05-21: qty 1

## 2021-05-21 MED ORDER — BUPROPION HCL ER (XL) 150 MG PO TB24
300.0000 mg | ORAL_TABLET | Freq: Every day | ORAL | Status: DC
  Administered 2021-05-21 – 2021-05-25 (×5): 300 mg via ORAL
  Filled 2021-05-21 (×2): qty 2
  Filled 2021-05-21: qty 1
  Filled 2021-05-21 (×2): qty 2

## 2021-05-21 MED ORDER — SODIUM CHLORIDE 0.9 % IV SOLN
500.0000 mg | INTRAVENOUS | Status: AC
  Administered 2021-05-21: 500 mg via INTRAVENOUS
  Filled 2021-05-21: qty 500

## 2021-05-21 MED ORDER — ALBUTEROL SULFATE (2.5 MG/3ML) 0.083% IN NEBU
2.5000 mg | INHALATION_SOLUTION | RESPIRATORY_TRACT | Status: DC | PRN
  Administered 2021-05-21: 2.5 mg via RESPIRATORY_TRACT

## 2021-05-21 MED ORDER — SERTRALINE HCL 25 MG PO TABS
25.0000 mg | ORAL_TABLET | Freq: Every day | ORAL | Status: DC
  Administered 2021-05-21 – 2021-05-25 (×5): 25 mg via ORAL
  Filled 2021-05-21 (×5): qty 1

## 2021-05-21 MED ORDER — IPRATROPIUM BROMIDE 0.02 % IN SOLN
0.5000 mg | RESPIRATORY_TRACT | Status: DC
  Administered 2021-05-21 (×5): 0.5 mg via RESPIRATORY_TRACT
  Filled 2021-05-21 (×5): qty 2.5

## 2021-05-21 MED ORDER — OXYCODONE HCL 5 MG PO TABS
15.0000 mg | ORAL_TABLET | Freq: Four times a day (QID) | ORAL | Status: DC | PRN
  Administered 2021-05-21 – 2021-05-22 (×4): 15 mg via ORAL
  Filled 2021-05-21 (×5): qty 3

## 2021-05-21 MED ORDER — ALBUTEROL SULFATE (2.5 MG/3ML) 0.083% IN NEBU
2.5000 mg | INHALATION_SOLUTION | RESPIRATORY_TRACT | Status: DC
  Administered 2021-05-21 (×5): 2.5 mg via RESPIRATORY_TRACT
  Filled 2021-05-21 (×5): qty 3

## 2021-05-21 MED ORDER — GABAPENTIN 400 MG PO CAPS
800.0000 mg | ORAL_CAPSULE | Freq: Three times a day (TID) | ORAL | Status: DC
  Administered 2021-05-21 – 2021-05-25 (×13): 800 mg via ORAL
  Filled 2021-05-21 (×15): qty 2

## 2021-05-21 MED ORDER — METHYLPREDNISOLONE SODIUM SUCC 40 MG IJ SOLR
40.0000 mg | Freq: Two times a day (BID) | INTRAMUSCULAR | Status: DC
  Administered 2021-05-21 – 2021-05-25 (×8): 40 mg via INTRAVENOUS
  Filled 2021-05-21 (×8): qty 1

## 2021-05-21 NOTE — ED Notes (Signed)
Patient is sleeping appears no acute distress.

## 2021-05-21 NOTE — Progress Notes (Signed)
I have discussed with admitting provider, currently patient changed her CODE STATUS to full code, so CODE STATUS has been changed to full code. Phillips Climes MD

## 2021-05-21 NOTE — ED Notes (Signed)
Pt states she does not want to be a DNR and states "I must have misunderstood the doctor when he asked me." MD notified.

## 2021-05-21 NOTE — Progress Notes (Signed)
PROGRESS NOTE    Tiasha TYIESHA BRACKNEY  YSA:[id number] DOB: 1941-01-10 DOA: 05/20/2021 PCP: Zoila Shutter, NP   Chief Complaint  Patient presents with   Shortness of Breath    Brief Narrative:   This is a no charge note as patient was seen and admitted earlier today, chart, imaging and labs were reviewed.    HPI: Caitlin P Kue is a 80 y.o. 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  Patient states she is chronically short of breath but last 2 days she has been having acutely worsening shortness of breath with cough productive of sputum denies any fever or chills.  Has chronic right-sided chest pain.  Patient was recently placed on oxycodone by patient's oncologist.   ED Course: In the ER patient was short of breath had to be placed on BiPAP.  Chest x-ray does not show anything acute.  High sensitive troponins were negative.  BNP is 151.  EKG shows normal sinus rhythm.  COVID test was negative.  Labs show worsening of anemia with hemoglobin of 7.6 and macrocytic picture.  Stool for occult blood was negative.  Patient admitted for acute respiratory failure with hypoxia secondary to COPD.   Assessment & Plan:   Principal Problem:   Acute on chronic respiratory failure with hypoxia (HCC) Active Problems:   Chronic diastolic heart failure (HCC)   Chronic pain syndrome   Small cell carcinoma of lung, right (HCC)   COPD exacerbation (HCC)   Acute respiratory failure with hypoxia (HCC)   Acute on chronic respiratory failure with hypoxia  - on BiPAP secondary to COPD exacerbation.  Patient has been placed on nebulizer Pulmicort and steroids and antibiotics.  We will slowly try to wean off the BiPAP.     History of chronic diastolic CHF  - We do not have any  record to have access in [facility].  Patient states he does take Lasix and spironolactone which we will continue.  Patient does not look to be in obvious fluid overload.  We will continue to monitor.  Small cell lung cancer  -She is being followed by Dr. Bobby Rumpf oncology at [facility] . intolerant to chemo being followed by oncologist.  Oncology notes in the chart mentions that for now they are holding off chemo.  Patient was recently placed on oxycodone and Ativan by oncologist.    Macrocytic anemia check anemia panel with next blood draw.  Follow CBC.  Stool for occult blood negative.   DVT prophylaxis: Lovenox Code Status: Full Disposition:   Status is: Inpatient  Remains inpatient appropriate because:IV treatments appropriate due to intensity of illness or inability to take PO and Inpatient level of care appropriate due to severity of illness  Dispo: The patient is from: Home              Anticipated d/c is to: Home              Patient currently is not medically stable to d/c.   Difficult to place patient No       Consultants:  none   Subjective:  Report dyspnea, cough Objective: Vitals:   05/21/21 1500 05/21/21 1600 05/21/21 1605 05/21/21 1645  BP: 137/60 (!) 150/108  (!) 143/73  Pulse: 82 87 87 86  Resp: (!) 25 (!) 28 20 18   Temp:      SpO2: 91% (!) 88% 92% 100%  Weight:      Height:        Intake/Output Summary (Last 24 hours) at 05/21/2021 1655 Last data filed at 05/21/2021 0408 Gross per 24 hour  Intake 250 ml  Output --  Net 250 ml   Filed Weights   05/20/21 2242  Weight: 71.7 kg    Examination:  Awake Alert, frail, deconditioned, chronically ill-appearing  Symmetrical Chest wall movement, nature entry bilaterally with wheezing RRR,No Gallops,Rubs or new Murmurs, No Parasternal Heave +ve B.Sounds, Abd Soft, No tenderness, No rebound - guarding or rigidity. No Cyanosis, Clubbing or edema, No new Rash or bruise      Data Reviewed:  I have personally reviewed following labs and imaging studies  CBC: Recent Labs  Lab 05/20/21 2251 05/20/21 2328 05/21/21 0254  WBC 5.3  --  4.8  NEUTROABS 3.8  --   --   HGB 7.6* 7.5* 7.7*  HCT 24.9* 22.0* 25.7*  MCV 102.0*  --  103.2*  PLT 179  --  144*    Basic Metabolic Panel: Recent Labs  Lab 05/20/21 2251 05/20/21 2328 05/21/21 0254  NA 139 139 138  K 4.5 4.2 4.5  CL 104  --  102  CO2 28  --  27  GLUCOSE 92  --  133*  BUN 21  --  17  CREATININE 0.96  --  0.90  CALCIUM 8.6*  --  8.8*    GFR: Estimated Creatinine Clearance: 50.3 mL/min (by C-G formula based on SCr of 0.9 mg/dL).  Liver Function Tests: No results for input(s): AST, ALT, ALKPHOS, BILITOT, PROT, ALBUMIN in the last 168 hours.  CBG: No results for input(s): GLUCAP in the last 168 hours.   Recent Results (from the past 240 hour(s))  Resp Panel by RT-PCR (Flu A&B, Covid) Nasopharyngeal Swab     Status: None   Collection Time: 05/20/21 10:51 PM   Specimen: Nasopharyngeal Swab; Nasopharyngeal(NP) swabs in vial transport medium  Result Value Ref Range Status   SARS Coronavirus 2 by RT PCR NEGATIVE NEGATIVE Final    Comment: (NOTE) SARS-CoV-2 target nucleic acids are NOT DETECTED.  The SARS-CoV-2 RNA is generally detectable in upper respiratory specimens during the acute phase of infection. The lowest concentration of SARS-CoV-2 viral copies this assay can detect is 138 copies/mL. A negative result does not preclude SARS-Cov-2 infection and should not be used as the sole basis for treatment or other patient management decisions. A negative result may occur with  improper specimen collection/handling, submission of specimen other than nasopharyngeal swab, presence of viral mutation(s) within the areas targeted by this assay, and inadequate number of viral copies(<138 copies/mL). A negative result must be combined with clinical observations, patient history, and epidemiological information. The  expected result is Negative.  Fact Sheet for Patients:  EntrepreneurPulse.com.au  Fact Sheet for Healthcare Providers:  IncredibleEmployment.be  This test is no t yet approved or cleared by the [place] FDA and  has been authorized for detection and/or diagnosis of SARS-CoV-2 by FDA under an Emergency Use Authorization (EUA). This EUA will remain  in effect (meaning this test can be used) for the duration of the COVID-19 declaration under Section 564(b)(1) of the Act, 21 U.S.C.section 360bbb-3(b)(1), unless the authorization is terminated  or revoked sooner.       Influenza A by PCR NEGATIVE NEGATIVE Final   Influenza B by  PCR NEGATIVE NEGATIVE Final    Comment: (NOTE) The Xpert Xpress SARS-CoV-2/FLU/RSV plus assay is intended as an aid in the diagnosis of influenza from Nasopharyngeal swab specimens and should not be used as a sole basis for treatment. Nasal washings and aspirates are unacceptable for Xpert Xpress SARS-CoV-2/FLU/RSV testing.  Fact Sheet for Patients: EntrepreneurPulse.com.au  Fact Sheet for Healthcare Providers: IncredibleEmployment.be  This test is not yet approved or cleared by the [place] FDA and has been authorized for detection and/or diagnosis of SARS-CoV-2 by FDA under an Emergency Use Authorization (EUA). This EUA will remain in effect (meaning this test can be used) for the duration of the COVID-19 declaration under Section 564(b)(1) of the Act, 21 U.S.C. section 360bbb-3(b)(1), unless the authorization is terminated or revoked.  Performed at [facility], [place] [address] [place], [place] [address]          Radiology Studies: DG Chest Port 1 View  Result Date: 05/20/2021 CLINICAL DATA:  Shortness of breath EXAM: PORTABLE CHEST 1 VIEW COMPARISON:  05/11/2021 FINDINGS: Right Port-A-Cath remains in place, unchanged. Scarring at the left base. Right lung  clear. Heart is normal size. No effusions or acute bony abnormality. IMPRESSION: Left basilar scarring. No active disease. Electronically Signed   By: Rolm Baptise M.D.   On: 05/20/2021 22:53        Scheduled Meds:  albuterol  2.5 mg Nebulization Q4H   aspirin EC  81 mg Oral Daily   [START ON 05/22/2021] azithromycin  500 mg Oral Daily   budesonide (PULMICORT) nebulizer solution  0.25 mg Nebulization BID   buPROPion  300 mg Oral Daily   enoxaparin (LOVENOX) injection  40 mg Subcutaneous Q24H   furosemide  20 mg Oral Daily   gabapentin  800 mg Oral TID   ipratropium  0.5 mg Nebulization Q4H   methylPREDNISolone (SOLU-MEDROL) injection  40 mg Intravenous Q12H   primidone  50 mg Oral QHS   sertraline  25 mg Oral Daily   spironolactone  25 mg Oral Daily   Continuous Infusions:   LOS: 0 days     Phillips Climes, MD [facility]   To contact the attending provider between 7A-7P or the covering provider during after hours 7P-7A, please log into the web site [email or web address] and access using universal [place] password for that web site. If you do not have the password, please call the hospital operator.  05/21/2021, 4:55 PM

## 2021-05-21 NOTE — ED Notes (Signed)
Patient repositioned in bed and new linens applied.

## 2021-05-21 NOTE — ED Notes (Signed)
SDU Breakfast Ordered 

## 2021-05-21 NOTE — H&P (Addendum)
History and Physical    [name]:[id number] DOB: 1941-08-15 DOA: 05/20/2021  PCP: Zoila Shutter, NP  Patient coming from: Home.  Chief Complaint: Shortness of breath.  HPI: Stephanie Garza is a 80 y.o. 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  Patient states she is chronically short of breath but last 2 days she has been having acutely worsening shortness of breath with cough productive of sputum denies any fever or chills.  Has chronic right-sided chest pain.  Patient was recently placed on oxycodone by patient's oncologist.  ED Course: In the ER patient was short of breath had to be placed on BiPAP.  Chest x-ray does not show anything acute.  High sensitive troponins were negative.  BNP is 151.  EKG shows normal sinus rhythm.  COVID test was negative.  Labs show worsening of anemia with hemoglobin of 7.6 and macrocytic picture.  Stool for occult blood was negative.  Patient admitted for acute respiratory failure with hypoxia secondary to COPD.  Review of Systems: As per HPI, rest all negative.   Past Medical History:  Diagnosis Date   Anemia    Anxiety    Arthritis    Asthma    COPD (chronic obstructive pulmonary disease) (HCC)    Depression    Fibromyalgia    GERD (gastroesophageal reflux disease)    Gout    Lung cancer ([place])     Past Surgical History:  Procedure Laterality Date   BTL     CATARACT EXTRACTION Bilateral    FOOT SURGERY Left      reports that she has quit smoking. She has never used smokeless tobacco. She reports that she does not currently use alcohol. She reports that she does not currently use drugs.  Allergies  Allergen Reactions   Cefaclor Rash    Family History  Problem Relation Age of Onset   Breast  cancer Mother    Colon cancer Father 45   Brain cancer Sister 26   Lung cancer Sister 23   Lupus Sister    Heart disease Brother    Breast cancer Daughter 26   Lung cancer Son 66   Skin cancer Niece 52    Prior to Admission medications   Medication Sig Start Date End Date Taking? Authorizing Provider  albuterol (VENTOLIN HFA) 108 (90 Base) MCG/ACT inhaler Inhale 2 puffs into the lungs every 4 (four) hours as needed for wheezing or shortness of breath. 08/28/18   [provider]  aspirin 81 MG EC tablet Take 81 mg by mouth daily.    [provider]  budesonide-formoterol (SYMBICORT) 160-4.5 MCG/ACT inhaler Inhale 2 puffs into the lungs 2 (two) times daily.    [provider]  buPROPion (WELLBUTRIN XL) 300 MG 24 hr tablet Take 300 mg by mouth daily. 04/09/21   [provider]  dicyclomine (BENTYL) 20 MG tablet Take 20 mg by mouth 2 (two) times daily as needed for spasms.    [provider]  diphenoxylate-atropine (LOMOTIL) 2.5-0.025 MG tablet Take 2 tablets by mouth 4 (four) times daily as needed for diarrhea or loose stools (1-2 TABLETS AFTER EACH LOOSE STOOL - MAXIMUM OF 10 DAILY). 04/26/21   Dayton Scrape A, NP  famotidine (PEPCID) 20 MG tablet Take 20 mg  by mouth daily.    [provider]  fluticasone (FLONASE) 50 MCG/ACT nasal spray Place 2 sprays into both nostrils daily as needed for allergies. 08/18/18   [provider]  furosemide (LASIX) 20 MG tablet Take 20 mg by mouth daily. 03/02/21   [provider]  gabapentin (NEURONTIN) 800 MG tablet Take 800 mg by mouth 3 (three) times daily. 05/18/21   [provider]  ipratropium-albuterol (DUONEB) 0.5-2.5 (3) MG/3ML SOLN Take 3 mLs by nebulization every 4 (four) hours as needed (shortness of breath). 08/15/18   [provider]  LORazepam (ATIVAN) 1 MG tablet Take 1.5 tablets (1.5 mg total) by mouth every 8 (eight) hours. 05/10/21   Melodye Ped, NP   nitrofurantoin, macrocrystal-monohydrate, (MACROBID) 100 MG capsule Take 1 capsule (100 mg total) by mouth 2 (two) times daily. 05/04/21   Dayton Scrape A, NP  omeprazole (PRILOSEC) 40 MG capsule Take 40 mg by mouth daily. 08/18/18   [provider]  ondansetron (ZOFRAN) 8 MG tablet Take 8 mg by mouth every 8 (eight) hours as needed for nausea.    [provider]  oxyCODONE (ROXICODONE) 15 MG immediate release tablet Take 1 tablet (15 mg total) by mouth every 6 (six) hours as needed for pain. 05/12/21   Dayton Scrape A, NP  potassium chloride SA (KLOR-CON) 20 MEQ tablet Take 20 mEq by mouth daily. 08/18/18   [provider]  primidone (MYSOLINE) 50 MG tablet Take 50 mg by mouth at bedtime.    [provider]  prochlorperazine (COMPAZINE) 10 MG tablet Take 1 tablet (10 mg total) by mouth every 6 (six) hours as needed for nausea or vomiting. 02/25/21   Dayton Scrape A, NP  sertraline (ZOLOFT) 25 MG tablet Take 25 mg by mouth daily.    [provider]  spironolactone (ALDACTONE) 25 MG tablet Take 25 mg by mouth daily.    [provider]  tamsulosin (FLOMAX) 0.4 MG CAPS capsule Take 0.4 mg by mouth daily.    [provider]  tiotropium (SPIRIVA HANDIHALER) 18 MCG inhalation capsule Place 18 mcg into inhaler and inhale daily. 08/28/18   [provider]  TRELEGY ELLIPTA 100-62.5-25 MCG/INH AEPB Inhale 1 puff into the lungs daily. 05/06/21   [provider]  Vitamin D, Ergocalciferol, (DRISDOL) 1.25 MG (50000 UNIT) CAPS capsule Take 50,000 Units by mouth every 7 (seven) days. 05/03/19   [provider]    Physical Exam: Constitutional: Moderately built and nourished. Vitals:   05/21/21 0100 05/21/21 0120 05/21/21 0130 05/21/21 0200  BP: 127/60  139/66 131/65  Pulse: 77 81 81 73  Resp: (!) 23 (!) 21 (!) 23 19  Temp:      SpO2: 98% 98% 96% 96%  Weight:      Height:       Eyes: Anicteric no pallor. ENMT: No  discharge from the ears eyes nose and mouth. Neck: No mass felt.  No neck rigidity.  No JVD appreciated. Respiratory: Mild expiratory wheeze and no crepitations. Cardiovascular: S1-S2 heard. Abdomen: Soft nontender bowel sound present. Musculoskeletal: No edema. Skin: No rash. Neurologic: Alert awake oriented to time place and person.  Moves all extremities. Psychiatric: Appears normal.  Normal affect.   Labs on Admission: I have personally reviewed following labs and imaging studies  CBC: Recent Labs  Lab 05/20/21 2251 05/20/21 2328  WBC 5.3  --   NEUTROABS 3.8  --   HGB 7.6* 7.5*  HCT 24.9* 22.0*  MCV 102.0*  --  PLT 179  --    Basic Metabolic Panel: Recent Labs  Lab 05/20/21 2251 05/20/21 2328  NA 139 139  K 4.5 4.2  CL 104  --   CO2 28  --   GLUCOSE 92  --   BUN 21  --   CREATININE 0.96  --   CALCIUM 8.6*  --    GFR: Estimated Creatinine Clearance: 47.2 mL/min (by C-G formula based on SCr of 0.96 mg/dL). Liver Function Tests: No results for input(s): AST, ALT, ALKPHOS, BILITOT, PROT, ALBUMIN in the last 168 hours. No results for input(s): LIPASE, AMYLASE in the last 168 hours. No results for input(s): AMMONIA in the last 168 hours. Coagulation Profile: No results for input(s): INR, PROTIME in the last 168 hours. Cardiac Enzymes: No results for input(s): CKTOTAL, CKMB, CKMBINDEX, TROPONINI in the last 168 hours. BNP (last 3 results) No results for input(s): PROBNP in the last 8760 hours. HbA1C: No results for input(s): HGBA1C in the last 72 hours. CBG: No results for input(s): GLUCAP in the last 168 hours. Lipid Profile: No results for input(s): CHOL, HDL, LDLCALC, TRIG, CHOLHDL, LDLDIRECT in the last 72 hours. Thyroid Function Tests: No results for input(s): TSH, T4TOTAL, FREET4, T3FREE, THYROIDAB in the last 72 hours. Anemia Panel: No results for input(s): VITAMINB12, FOLATE, FERRITIN, TIBC, IRON, RETICCTPCT in the last 72 hours. Urine analysis: No  results found for: COLORURINE, APPEARANCEUR, LABSPEC, PHURINE, GLUCOSEU, HGBUR, BILIRUBINUR, KETONESUR, PROTEINUR, UROBILINOGEN, NITRITE, LEUKOCYTESUR Sepsis Labs: @LABRCNTIP (procalcitonin:4,lacticidven:4) ) Recent Results (from the past 240 hour(s))  Resp Panel by RT-PCR (Flu A&B, Covid) Nasopharyngeal Swab     Status: None   Collection Time: 05/20/21 10:51 PM   Specimen: Nasopharyngeal Swab; Nasopharyngeal(NP) swabs in vial transport medium  Result Value Ref Range Status   SARS Coronavirus 2 by RT PCR NEGATIVE NEGATIVE Final    Comment: (NOTE) SARS-CoV-2 target nucleic acids are NOT DETECTED.  The SARS-CoV-2 RNA is generally detectable in upper respiratory specimens during the acute phase of infection. The lowest concentration of SARS-CoV-2 viral copies this assay can detect is 138 copies/mL. A negative result does not preclude SARS-Cov-2 infection and should not be used as the sole basis for treatment or other patient management decisions. A negative result may occur with  improper specimen collection/handling, submission of specimen other than nasopharyngeal swab, presence of viral mutation(s) within the areas targeted by this assay, and inadequate number of viral copies(<138 copies/mL). A negative result must be combined with clinical observations, patient history, and epidemiological information. The expected result is Negative.  Fact Sheet for Patients:  EntrepreneurPulse.com.au  Fact Sheet for Healthcare Providers:  IncredibleEmployment.be  This test is no t yet approved or cleared by the [place] FDA and  has been authorized for detection and/or diagnosis of SARS-CoV-2 by FDA under an Emergency Use Authorization (EUA). This EUA will remain  in effect (meaning this test can be used) for the duration of the COVID-19 declaration under Section 564(b)(1) of the Act, 21 U.S.C.section 360bbb-3(b)(1), unless the authorization is terminated   or revoked sooner.       Influenza A by PCR NEGATIVE NEGATIVE Final   Influenza B by PCR NEGATIVE NEGATIVE Final    Comment: (NOTE) The Xpert Xpress SARS-CoV-2/FLU/RSV plus assay is intended as an aid in the diagnosis of influenza from Nasopharyngeal swab specimens and should not be used as a sole basis for treatment. Nasal washings and aspirates are unacceptable for Xpert Xpress SARS-CoV-2/FLU/RSV testing.  Fact Sheet for Patients: EntrepreneurPulse.com.au  Fact  Sheet for Healthcare Providers: IncredibleEmployment.be  This test is not yet approved or cleared by the [place] and has been authorized for detection and/or diagnosis of SARS-CoV-2 by FDA under an Emergency Use Authorization (EUA). This EUA will remain in effect (meaning this test can be used) for the duration of the COVID-19 declaration under Section 564(b)(1) of the Act, 21 U.S.C. section 360bbb-3(b)(1), unless the authorization is terminated or revoked.  Performed at [facility], [place] [address], [place], [place] [address]      Radiological Exams on Admission: DG Chest Port 1 View  Result Date: 05/20/2021 CLINICAL DATA:  Shortness of breath EXAM: PORTABLE CHEST 1 VIEW COMPARISON:  05/11/2021 FINDINGS: Right Port-A-Cath remains in place, unchanged. Scarring at the left base. Right lung clear. Heart is normal size. No effusions or acute bony abnormality. IMPRESSION: Left basilar scarring. No active disease. Electronically Signed   By: Rolm Baptise M.D.   On: 05/20/2021 22:53    EKG: Independently reviewed.  Normal sinus rhythm.  Assessment/Plan Principal Problem:   Acute on chronic respiratory failure with hypoxia (HCC) Active Problems:   Chronic diastolic heart failure (HCC)   Chronic pain syndrome   Small cell carcinoma of lung, right (HCC)   COPD exacerbation (HCC)   Acute respiratory failure with hypoxia (HCC)    Acute on chronic respiratory failure  with hypoxia presently on BiPAP secondary to COPD exacerbation.  Patient has been placed on nebulizer Pulmicort and steroids and antibiotics.  We will slowly try to wean off the BiPAP.  Patient has confirmed she does not want to be resuscitated.  Patient is a DNR. History of chronic diastolic CHF last EF unknown.  We do not have any record to have access in [facility].  Patient states he does take Lasix and spironolactone which we will continue.  Patient does not look to be in obvious fluid overload.  We will continue to monitor. 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  Oncology notes in the chart mentions that for now they are holding off chemo.  Patient was recently placed on oxycodone and Ativan by oncologist.  We will consult palliative care. Macrocytic anemia check anemia panel with next blood draw.  Follow CBC.  Stool for occult blood negative.  Since patient has acute respiratory failure requiring BiPAP will need close monitoring for any further worsening inpatient status.   DVT prophylaxis: Lovenox. Code Status: Full code. Family Communication: Discussed with patient. Disposition Plan: Home when stable. Consults called: Palliative care. Admission status: Inpatient.   Rise Patience MD [facility] Pager [phone].  If 7PM-7AM, please contact night-coverage [email or web address] Password [email or web address]  05/21/2021, 2:20 AM

## 2021-05-21 NOTE — ED Notes (Signed)
Spoke with daughter Lynelle Smoke [phone] update given;.

## 2021-05-21 NOTE — Plan of Care (Signed)

## 2021-05-22 DIAGNOSIS — I5032 Chronic diastolic (congestive) heart failure: Secondary | ICD-10-CM | POA: Diagnosis not present

## 2021-05-22 DIAGNOSIS — J441 Chronic obstructive pulmonary disease with (acute) exacerbation: Secondary | ICD-10-CM | POA: Diagnosis not present

## 2021-05-22 DIAGNOSIS — G894 Chronic pain syndrome: Secondary | ICD-10-CM | POA: Diagnosis not present

## 2021-05-22 DIAGNOSIS — C3491 Malignant neoplasm of unspecified part of right bronchus or lung: Secondary | ICD-10-CM | POA: Diagnosis not present

## 2021-05-22 DIAGNOSIS — F419 Anxiety disorder, unspecified: Secondary | ICD-10-CM

## 2021-05-22 DIAGNOSIS — J9621 Acute and chronic respiratory failure with hypoxia: Secondary | ICD-10-CM

## 2021-05-22 DIAGNOSIS — Z7189 Other specified counseling: Secondary | ICD-10-CM

## 2021-05-22 DIAGNOSIS — Z515 Encounter for palliative care: Secondary | ICD-10-CM

## 2021-05-22 DIAGNOSIS — Z789 Other specified health status: Secondary | ICD-10-CM

## 2021-05-22 LAB — BASIC METABOLIC PANEL
Anion gap: 5 (ref 5–15)
BUN: 21 mg/dL (ref 8–23)
CO2: 29 mmol/L (ref 22–32)
Calcium: 8.9 mg/dL (ref 8.9–10.3)
Chloride: 103 mmol/L (ref 98–111)
Creatinine, Ser: 1.06 mg/dL — ABNORMAL HIGH (ref 0.44–1.00)
GFR, Estimated: 53 mL/min — ABNORMAL LOW (ref >60)
Glucose, Bld: 119 mg/dL — ABNORMAL HIGH (ref 70–99)
Potassium: 4.8 mmol/L (ref 3.5–5.1)
Sodium: 137 mmol/L (ref 135–145)

## 2021-05-22 LAB — IRON AND TIBC
Iron: 65 ug/dL (ref 28–170)
Saturation Ratios: 30 % (ref 10.4–31.8)
TIBC: 217 ug/dL — ABNORMAL LOW (ref 250–450)
UIBC: 152 ug/dL

## 2021-05-22 LAB — FOLATE: Folate: 8.1 ng/mL (ref >5.9)

## 2021-05-22 LAB — CBC
HCT: 22.3 % — ABNORMAL LOW (ref 36.0–46.0)
Hemoglobin: 7.1 g/dL — ABNORMAL LOW (ref 12.0–15.0)
MCH: 31 pg (ref 26.0–34.0)
MCHC: 31.8 g/dL (ref 30.0–36.0)
MCV: 97.4 fL (ref 80.0–100.0)
Platelets: 138 10*3/uL — ABNORMAL LOW (ref 150–400)
RBC: 2.29 MIL/uL — ABNORMAL LOW (ref 3.87–5.11)
RDW: 14.9 % (ref 11.5–15.5)
WBC: 3.4 10*3/uL — ABNORMAL LOW (ref 4.0–10.5)
nRBC: 0 % (ref 0.0–0.2)

## 2021-05-22 LAB — RETICULOCYTES
Immature Retic Fract: 9.5 % (ref 2.3–15.9)
RBC.: 2.58 MIL/uL — ABNORMAL LOW (ref 3.87–5.11)
Retic Count, Absolute: 50.6 10*3/uL (ref 19.0–186.0)
Retic Ct Pct: 2 % (ref 0.4–3.1)

## 2021-05-22 LAB — HEMOGLOBIN AND HEMATOCRIT, BLOOD
HCT: 28.5 % — ABNORMAL LOW (ref 36.0–46.0)
Hemoglobin: 9.3 g/dL — ABNORMAL LOW (ref 12.0–15.0)

## 2021-05-22 LAB — ABO/RH: ABO/RH(D): O POS

## 2021-05-22 LAB — PREPARE RBC (CROSSMATCH)

## 2021-05-22 LAB — FERRITIN: Ferritin: 210 ng/mL (ref 11–307)

## 2021-05-22 LAB — VITAMIN B12: Vitamin B-12: 76 pg/mL — ABNORMAL LOW (ref 180–914)

## 2021-05-22 MED ORDER — SODIUM CHLORIDE 0.9% IV SOLUTION: Freq: Once | INTRAVENOUS | Status: AC

## 2021-05-22 MED ORDER — CYANOCOBALAMIN 1000 MCG/ML IJ SOLN
1000.0000 ug | Freq: Every day | INTRAMUSCULAR | Status: DC
  Administered 2021-05-22 – 2021-05-25 (×4): 1000 ug via INTRAMUSCULAR
  Filled 2021-05-22 (×4): qty 1

## 2021-05-22 MED ORDER — LORAZEPAM 1 MG PO TABS
1.5000 mg | ORAL_TABLET | Freq: Three times a day (TID) | ORAL | Status: DC
  Administered 2021-05-22 – 2021-05-25 (×10): 1.5 mg via ORAL
  Filled 2021-05-22 (×10): qty 1

## 2021-05-22 MED ORDER — OXYCODONE HCL ER 15 MG PO T12A
30.0000 mg | EXTENDED_RELEASE_TABLET | Freq: Two times a day (BID) | ORAL | Status: DC
Start: 2021-05-22 — End: 2021-05-25
  Administered 2021-05-22 – 2021-05-25 (×7): 30 mg via ORAL
  Filled 2021-05-22 (×7): qty 2

## 2021-05-22 MED ORDER — CYANOCOBALAMIN 1000 MCG/ML IJ SOLN: 1000.0000 ug | INTRAMUSCULAR | Status: DC

## 2021-05-22 MED ORDER — OXYCODONE HCL 5 MG PO TABS
5.0000 mg | ORAL_TABLET | ORAL | Status: DC | PRN
  Administered 2021-05-22: 5 mg via ORAL
  Filled 2021-05-22: qty 1

## 2021-05-22 NOTE — Progress Notes (Signed)
Pt alert and aware sitting up in chair, family present at bedside. Pt states she is doing better. She had requested prayers. The chaplain offered caring and supportive presence prayers and blessings.

## 2021-05-22 NOTE — Consult Note (Addendum)
Consultation Note Date: 05/22/2021   Patient Name: Stephanie Garza  DOB: [birth year]  MRN: [id number]  Age / Sex: 80 y.o., female  PCP: Stephanie Shutter, NP Referring Physician: Albertine Patricia, MD  Reason for Consultation: Establishing goals of care  HPI/Patient Profile: 80 y.o. female  with past medical history of COPD on home oxygen 3 L, chronic diastolic CHF, and recent diagnosis of small cell lung cancer presented to the ED on 05/20/21 from home with complaints of worsening shortness of breath x2 days with productive cough. Patient was admitted on 05/20/2021 with acute on chronic respiratory failure with hypoxia secondary to COPD exacerbation, small cell lung cancer.   ED Course: In the ER patient was short of breath had to be placed on BiPAP.  Chest x-ray does not show anything acute.  High sensitive troponins were negative.  BNP is 151.  EKG shows normal sinus rhythm.  COVID test was negative.  Labs show worsening of anemia with hemoglobin of 7.6 and macrocytic picture.  Stool for occult blood was negative.  Patient admitted for acute respiratory failure with hypoxia secondary to COPD.  Clinical Assessment and Goals of Care: I have reviewed medical records including EPIC notes, labs, and imaging. Received report from primary RN - no acute concerns.   Went to visit patient at bedside - no family/visitors present but patient was speaking with her daughter/Stephanie Garza via speakerphone. Patient was lying in bed awake, alert, oriented, and able to participate in conversation. No signs or non-verbal gestures of pain or discomfort noted. No respiratory distress or increased work of breathing noted. Patient does have a congested cough. Patient endorses chronic back, left knee, and left shoulder pain - she feels her pain is moderately managed at this time due to medication wearing off and having to wait for her next  dose. She states currently her pain is 7/10. Patient expresses frustration that she has to wait for her ativan - explained the difference between PRN and scheduled medication. Stephanie Garza states she takes it scheduled every 8 hours at home. Reviewed symptom management plan of long acting opioid with PRN breakthrough doses as well as scheduling ativan - patient and daughter were appreciative and agreeable.   Met with patient and daughter/Stephanie  to discuss diagnosis, prognosis, GOC, EOL wishes, disposition, and options.  I introduced Palliative Medicine as specialized medical care for people living with serious illness. It focuses on providing relief from the symptoms and stress of a serious illness. The goal is to improve quality of life for both the patient and the family.  We discussed a brief life review of the patient as well as functional and nutritional status. Patient's husband passed away in 12-Dec-2009 - they had 3 daughters and 1 son, of which one daughter has also passed away. Prior to hospitalization, patient was living in a private residence with her daughter/Stephanie, son/Stephanie Garza, and grandson. Patient reports ambulating independently with a walker and being able to bathe independently. She does occasionally need assistance dressing herself. She has  no HH services visit her home. Patient reports her appetite at home is "sometimes good and sometimes bad." She explains that she often has to "force" herself to eat and drink ensure - she understands that adequate nutrition is important to regaining strength. Patient denies feeling nauseated, but rather states she "gets too full and it's hard to breath." Reviewed how this is common in more advanced stages of COPD. Patient was diagnosed with lung cancer 3 months ago - she has been seen by a Arboriculturist but received chemotherapy at [facility]. Stephanie reports that over the last several months the patient has gotten "weaker and weaker" and her "mobility has  decreased." Validated that chemotherapy can create a health burden and comes with may side effects.   We discussed patient's current illness and what it means in the larger context of patient's on-going co-morbidities. Education provided to Stephanie Garza and Stephanie that COPD is a progressive, non-curable disease underlying the patient's current acute medical conditions/lung cancer. Natural disease trajectory and expectations at EOL were discussed. I attempted to elicit values and goals of care important to the patient. The difference between aggressive medical intervention and comfort care was considered in light of the patient's goals of care. Patient is clear that her goal is "to be more active and get stronger so I can do more chemo." We discussed the importance of nutrition and active ROM exercises to meet this goal. Patient is open to [facility] PT/OT or rehab if recommended, preferring [place] [facility]. Stephanie states family have no issues caring for patient at home at discharge. When asked if Stephanie Garza felt she had a good quality of life, she said "I want to have life again" - she expresses a good quality of life to her would be being able to go to the grocery store, cook, and plant some flowers. Validation provided that these tasks are important to her - educated that with progressive diseases and/or cancer that is not curable, she may unfortunately not be able to complete tasks as she's used to. Stephanie Garza expressed understanding but is hopeful that she can. Patient and daughter state that they are not happy with the care she had gotten with [facility] oncology - stating patient has missed appointments only because of her hospital stays, not because treatment is not important to her. They request to see an oncologist through [facility] for a second opinion - stated I would let attending know - they understand this consult may be inpatient or outpatient. They are hopeful patient can get a PET scan. Stephanie Garza also is agreeable to  blood transfusion today as it aligns with her goal to medically optimize in hopes to continue chemo.    Hospice and Palliative Care services outpatient were explained and offered. Patient is not ready for hospice services. She is agreeable to outpatient Palliative Care at discharge.  Advance directives, concepts specific to code status, artificial feeding and hydration, and rehospitalization were considered and discussed. Patient states that she does have HCPOA/Stephanie Dobransky - states the documents are at [facility]. Encouraged patient/family to consider DNR/DNI status understanding evidenced based poor outcomes in similar hospitalized patient, as the cause of arrest is likely associated with advanced chronic/terminal illness rather than an easily reversible acute cardio-pulmonary event. I explained that DNR/DNI does not change the medical plan and it only comes into effect after a person has arrested (died).  It is a protective measure to keep [place] from harming the patient in their last moments of life. Patient/Family  were not agreeable to DNR/DNI with understanding that she would receive CPR, defibrillation, ACLS medications, and/or intubation. Attempted to discuss medical boundaries and if she would ever want intubation/trach long term - patient stated this decision would be made by her family. Patient' states "I want to live for my grandson." I encouraged her to continue to think and have conversations with family about these types of limitations, if knowing getting them would not bring her back to an independent quality of life. We did discuss that as people's health changes over time and quality of life declines, people often change their minds on what they are willing to go through based on the quality of life we would bring them back to. We discussed her high risk of rehospitalization - she states she is open to rehospitalization, saying "it doesn't bother me."  Offered chaplain services of prayer  and emotional support - patient is agreeable and is of [employer].   Discussed with patient/family the importance of continued conversation with each other and the medical providers regarding overall plan of care and treatment options, ensuring decisions are within the context of the patient's values and GOCs.    Questions and concerns were addressed. The patient/family was encouraged to call with questions and/or concerns. PMT card/number was provided.  Reviewed previous 24hr use of PRN oxycodone, which was 36m/24 hours.    Primary Decision Maker: patient. Daughter/Stephanie SBerneice Heinrichis [facility] not have [facility]    SUMMARY OF RECOMMENDATIONS   Continue current full scope medical treatment Continue full code status Patient's goal is to regain strength to be able to continue chemotherapy. If needed, [facility] or rehab facility. Family state they have no issues continuing to care for patient at home. Patient and family [facility] attending notified. They are hopeful to get PET scan Oxycontin 340mPO q12h for attempt at better pain management. Oxycodone IR 65m71mO q4hr PRN breakthrough pain Adjusted ativan from PRN to scheduled q8 hours TOC consulted for: outpatient Palliative Care referral Chaplain consulted for: patient's request for prayer and emotional support PMT will continue to follow and support holistically   Code Status/Advance Care Planning: Full code   Palliative Prophylaxis:  Aspiration, Bowel Regimen, Delirium Protocol, Frequent Pain Assessment, Oral Care, and Turn Reposition  Additional Recommendations (Limitations, Scope, Preferences): Full Scope Treatment  Psycho-social/Spiritual:  Desire for further Chaplaincy support:yes Additional Recommendations: Grief/Bereavement Support with outpatient Palliative Care organization  Prognosis:  Unable to determine  Discharge Planning: To Be Determined       Primary Diagnoses: Present on Admission:  COPD exacerbation (HCCArispeAcute on chronic respiratory failure with hypoxia (HCC)  Chronic diastolic heart failure (HCC)  Chronic pain syndrome  Small cell carcinoma of lung, right (HCC)  [place] respiratory [place] with [place] ([place] I have reviewed the medical record, interviewed the patient and family, and examined the patient. The following aspects are pertinent.  Past Medical History:  Diagnosis Date   Anemia    Anxiety    Arthritis    Asthma    COPD (chronic obstructive pulmonary disease) (HCC)    Depression    Fibromyalgia    GERD (gastroesophageal reflux disease)    Gout    Lung cancer (HCCSouth Beth[place]  Social History   Socioeconomic History   Marital status: Widowed    Spouse name: Not on file   Number of children: 4   Years of education: 1165  Highest education level: Not on file  Occupational History   Occupation: RETIRED    Comment: SELF EMPLOYED  Tobacco Use   Smoking status: Former   Smokeless tobacco: Never  Vaping Use   Vaping Use: Every day  Substance and Sexual Activity   Alcohol use: Not Currently   Drug use: Not Currently   Sexual activity: Not Currently  Other Topics Concern   Not on file  Social History Narrative   Not on file   Social Determinants of Health   Financial Resource Strain: Not on file  Food Insecurity: Not on file  Transportation Needs: Not on file  Physical Activity: Not on file  Stress: Not on file  Social Connections: Not on file   Family History  Problem Relation Age of Onset   Breast cancer Mother    Colon cancer Father 28   Brain cancer Sister 21   Lung cancer Sister 17   Lupus Sister    Heart disease Brother    Breast cancer Daughter 32   Lung cancer Son 62   Skin cancer Niece 25   Scheduled Meds:  sodium chloride   Intravenous Once   aspirin EC  81 mg Oral Daily   azithromycin  500 mg Oral Daily   budesonide (PULMICORT) nebulizer solution  0.25 mg Nebulization BID    buPROPion  300 mg Oral Daily   enoxaparin (LOVENOX) injection  40 mg Subcutaneous Q24H   furosemide  20 mg Oral Daily   gabapentin  800 mg Oral TID   ipratropium-albuterol  3 mL Nebulization TID   methylPREDNISolone (SOLU-MEDROL) injection  40 mg Intravenous Q12H   primidone  50 mg Oral QHS   sertraline  25 mg Oral Daily   spironolactone  25 mg Oral Daily   Continuous Infusions: PRN Meds:.acetaminophen **OR** acetaminophen, albuterol, dicyclomine, LORazepam, oxyCODONE Medications Prior to Admission:  Prior to Admission medications   Medication Sig Start Date End Date Taking? Authorizing Provider  albuterol (VENTOLIN HFA) 108 (90 Base) MCG/ACT inhaler Inhale 2 puffs into the lungs every 4 (four) hours as needed for wheezing or shortness of breath. 08/28/18  Yes [provider]  aspirin 81 MG EC tablet Take 81 mg by mouth daily.   Yes [provider]  budesonide-formoterol (SYMBICORT) 160-4.5 MCG/ACT inhaler Inhale 2 puffs into the lungs 2 (two) times daily.   Yes [provider]  buPROPion (WELLBUTRIN XL) 300 MG 24 hr tablet Take 300 mg by mouth daily. 04/09/21  Yes [provider]  dicyclomine (BENTYL) 20 MG tablet Take 20 mg by mouth 2 (two) times daily as needed for spasms.   Yes [provider]  diphenoxylate-atropine (LOMOTIL) 2.5-0.025 MG tablet Take 2 tablets by mouth 4 (four) times daily as needed for diarrhea or loose stools (1-2 TABLETS AFTER EACH LOOSE STOOL - MAXIMUM OF 10 DAILY). 04/26/21  Yes Dayton Scrape A, NP  famotidine (PEPCID) 20 MG tablet Take 20 mg by mouth daily.   Yes [provider]  fluticasone (FLONASE) 50 MCG/ACT nasal spray Place 2 sprays into both nostrils daily as needed for allergies. 08/18/18  Yes [provider]  furosemide (LASIX) 20 MG tablet Take 20 mg by mouth daily. 03/02/21  Yes [provider]  gabapentin (NEURONTIN) 800 MG tablet Take 800 mg by mouth 3 (three) times daily. 05/18/21   Yes [provider]  ipratropium-albuterol (DUONEB) 0.5-2.5 (3) MG/3ML SOLN Take 3 mLs by nebulization every 4 (four) hours as needed (shortness of breath). 08/15/18  Yes [provider]  LORazepam (ATIVAN) 1 MG tablet Take 1.5 tablets (1.5 mg total) by mouth every 8 (eight) hours. Patient taking differently: Take 1.5 mg by mouth every 8 (eight) hours as needed for anxiety. 05/10/21  Yes Dayton Scrape A, NP  omeprazole (PRILOSEC) 40 MG capsule Take 40 mg by mouth daily. 08/18/18  Yes [provider]  ondansetron (ZOFRAN) 8 MG tablet Take 8 mg by mouth every 8 (eight) hours as needed for nausea.   Yes [provider]  oxyCODONE (ROXICODONE) 15 MG immediate release tablet Take 1 tablet (15 mg total) by mouth every 6 (six) hours as needed for pain. 05/12/21  Yes Dayton Scrape A, NP  OXYGEN Inhale 3 L into the lungs continuous.   Yes [provider]  potassium chloride SA (KLOR-CON) 20 MEQ tablet Take 20 mEq by mouth daily. 08/18/18  Yes [provider]  primidone (MYSOLINE) 50 MG tablet Take 50 mg by mouth at bedtime.   Yes [provider]  prochlorperazine (COMPAZINE) 10 MG tablet Take 1 tablet (10 mg total) by mouth every 6 (six) hours as needed for nausea or vomiting. 02/25/21  Yes Dayton Scrape A, NP  sertraline (ZOLOFT) 25 MG tablet Take 25 mg by mouth daily.   Yes [provider]  spironolactone (ALDACTONE) 25 MG tablet Take 25 mg by mouth daily.   Yes [provider]  tamsulosin (FLOMAX) 0.4 MG CAPS capsule Take 0.4 mg by mouth daily.   Yes [provider]  tiotropium (SPIRIVA HANDIHALER) 18 MCG inhalation capsule Place 18 mcg into inhaler and inhale daily. 08/28/18  Yes [provider]  TRELEGY ELLIPTA 100-62.5-25 MCG/INH AEPB Inhale 1 puff into the lungs daily. 05/06/21  Yes [provider]  Vitamin D, Ergocalciferol, (DRISDOL) 1.25 MG (50000 UNIT) CAPS capsule Take 50,000 Units by  mouth every 7 (seven) days. 05/03/19  Yes [provider]  nitrofurantoin, macrocrystal-monohydrate, (MACROBID) 100 MG capsule Take 1 capsule (100 mg total) by mouth 2 (two) times daily. Patient not taking: Reported on 05/21/2021 05/04/21   Dayton Scrape A, NP   Allergies  Allergen Reactions   Cefaclor Rash   Review of Systems  Constitutional:  Positive for activity change, appetite change and fatigue.  HENT:  Negative for trouble swallowing.   Respiratory:  Positive for shortness of breath.   Gastrointestinal:  Negative for nausea and vomiting.  Neurological:  Positive for weakness.  All other systems reviewed and are negative.  Physical Exam Vitals and nursing note reviewed.  Constitutional:      General: She is not in acute distress.    Appearance: She is ill-appearing.  Pulmonary:     Effort: No respiratory distress.     Comments: Congested cough Skin:    General: Skin is warm and dry.  Neurological:     Mental Status: She is alert and oriented to person, place, and time.     Motor: Weakness present.  Psychiatric:        Attention and Perception: Attention normal.        Behavior: Behavior is cooperative.        Cognition and Memory: Cognition and memory normal.    Vital Signs: BP (!) 157/92 (BP Location: Right Arm)   Pulse 73   Temp 98.1 F (36.7 C) (Oral)   Resp 19   Ht 5' 5"  (1.651 m)   Wt 71.7 kg   SpO2 100%   BMI 26.29 kg/m  Pain Scale: 0-10   Pain  Score: 8    SpO2: SpO2: 100 % O2 Device:SpO2: 100 % O2 Flow Rate: .O2 Flow Rate (L/min): 4 L/min  IO: Intake/output summary:  Intake/Output Summary (Last 24 hours) at 05/22/2021 1030 Last data filed at 05/22/2021 0934 Gross per 24 hour  Intake 240 ml  Output --  Net 240 ml    LBM:   Baseline Weight: Weight: 71.7 kg Most recent weight: Weight: 71.7 kg     Palliative Assessment/Data: 60%     Time In: 1045 Time Out: 1215 Time Total: 90 minutes  Greater than 50%  of this time was spent  counseling and coordinating care related to the above assessment and plan.  Signed by: Lin Landsman, NP   Please contact Palliative Medicine Team phone at [phone] for questions and concerns.  For individual provider: See Shea Evans

## 2021-05-22 NOTE — Progress Notes (Signed)
Attempted to educate pt on Blood transfusion, pt stated her daughter signs and makes all decisions for her . Attempted to call daughter @ [phone], no response . Will attempt to call back.

## 2021-05-22 NOTE — Evaluation (Signed)
Physical Therapy Evaluation Patient Details Name: Stephanie Garza MRN: [id number] DOB: [birth year] Today's Date: 05/22/2021   History of Present Illness  80 y.o. female [facility] to [facility] hospital on 05/21/2021 with worsening SOB. Pt admitted for acute on chronic respiratory failure with hypoxia. PMH includes COPD, CHF, small cell lung cancer.  Clinical Impression  Pt presents to PT with deficits in activity tolerance, strength, power, balance, gait. Pt's activity tolerance is chronically limited due to COPD and lung cancer, however pt reports increased weakness recently. Pt is able to ambulate and transfer without physical assistance at this time. Pt will benefit from acute PT services to improve activity tolerance and to strengthen lowe extremities to aide in increasing independence and a return to limited community mobility.     Follow Up Recommendations Home health PT;Supervision - Intermittent    Equipment Recommendations  None recommended by PT    Recommendations for Other Services       Precautions / Restrictions Precautions Precautions: Fall Restrictions Weight Bearing Restrictions: No      Mobility  Bed Mobility Overal bed mobility: Needs Assistance Bed Mobility: Supine to Sit     Supine to sit: Supervision;HOB elevated     General bed mobility comments: use of rails    Transfers Overall transfer level: Needs assistance Equipment used: Rolling walker (2 wheeled) Transfers: Sit to/from Stand Sit to Stand: Supervision            Ambulation/Gait Ambulation/Gait assistance: Supervision Gait Distance (Feet): 60 Feet Assistive device: Rolling walker (2 wheeled) Gait Pattern/deviations: Step-through pattern Gait velocity: reduced Gait velocity interpretation: <1.8 ft/sec, indicate of risk for recurrent falls General Gait Details: pt with step-through gait, intermittent increase in trunk flexion when leaning onto walker  Stairs            Wheelchair  Mobility    Modified Rankin (Stroke Patients Only)       Balance Overall balance assessment: Needs assistance Sitting-balance support: Feet supported;No upper extremity supported Sitting balance-Leahy Scale: Fair     Standing balance support: No upper extremity supported Standing balance-Leahy Scale: Fair Standing balance comment: dons mask in standing without UE support                             Pertinent Vitals/Pain Pain Assessment: No/denies pain    Home Living Family/patient expects to be discharged to:: Private residence Living Arrangements: Children;Other relatives (daughter, son, grandson) Available Help at Discharge: Family;Available 24 hours/day Type of Home: Mobile home Home Access: Ramped entrance (ramp being built currently)     Home Layout: One level Home Equipment: Walker - 2 wheels;Walker - 4 wheels;Cane - single point;Bedside commode;Shower seat;Tub bench      Prior Function Level of Independence: Needs assistance   Gait / Transfers Assistance Needed: pt reports ambulating with use of walker for household distances. grandson assists with managing oxygen tubing  ADL's / Homemaking Assistance Needed: requires assistance with dressing, otherwise independent with ADLs, assistance required for IADLs        Hand Dominance        Extremity/Trunk Assessment   Upper Extremity Assessment Upper Extremity Assessment: Generalized weakness;LUE deficits/detail LUE Deficits / Details: LUE shoulder mobility grossly limited due to OA per patient report    Lower Extremity Assessment Lower Extremity Assessment: Generalized weakness    Cervical / Trunk Assessment Cervical / Trunk Assessment: Kyphotic  Communication   Communication: No difficulties  Cognition Arousal/Alertness: Awake/alert Behavior During Therapy:  WFL for tasks assessed/performed Overall Cognitive Status: Within Functional Limits for tasks assessed                                         General Comments General comments (skin integrity, edema, etc.): pt on 4LNC upon arrival, pt sats range from 91-96% observed when mobilizing. Pt reports SOB during and upon completion of ambulation with wheezing noted. After 1-2 minute seated rest break wheezing subsides    Exercises     Assessment/Plan    PT Assessment Patient needs continued PT services  PT Problem List Decreased strength;Decreased range of motion;Decreased activity tolerance;Decreased balance;Decreased mobility;Cardiopulmonary status limiting activity       PT Treatment Interventions DME instruction;Gait training;Stair training;Functional mobility training;Therapeutic activities;Therapeutic exercise;Balance training;Patient/family education    PT Goals (Current goals can be found in the Care Plan section)  Acute Rehab PT Goals Patient Stated Goal: to cook again and tolerate walking into grocery store PT Goal Formulation: With patient Time For Goal Achievement: 06/05/21 Potential to Achieve Goals: Fair    Frequency Min 3X/week   Barriers to discharge        Co-evaluation               AM-PAC PT "6 Clicks" Mobility  Outcome Measure Help needed turning from your back to your side while in a flat bed without using bedrails?: A Little Help needed moving from lying on your back to sitting on the side of a flat bed without using bedrails?: A Little Help needed moving to and from a bed to a chair (including a wheelchair)?: A Little Help needed standing up from a chair using your arms (e.g., wheelchair or bedside chair)?: A Little Help needed to walk in hospital room?: A Little Help needed climbing 3-5 steps with a railing? : A Lot 6 Click Score: 17    End of Session Equipment Utilized During Treatment: Oxygen Activity Tolerance: Patient limited by fatigue Patient left: in chair;with call bell/phone within reach Nurse Communication: Mobility status PT Visit Diagnosis: Other  abnormalities of gait and mobility (R26.89)    Time: 1355-1415 PT Time Calculation (min) (ACUTE ONLY): 20 min   Charges:   PT Evaluation $PT Eval Low Complexity: 1 Low          Zenaida Niece, PT, DPT Acute Rehabilitation Pager: [phone]   Zenaida Niece 05/22/2021, 2:30 PM

## 2021-05-22 NOTE — Progress Notes (Signed)
PROGRESS NOTE    Stephanie Garza  GYF:[id number] DOB: [birth year] DOA: 05/20/2021 PCP: Zoila Shutter, NP   Chief Complaint  Patient presents with   Shortness of Breath    Brief Narrative:   Stephanie Garza is a 80 y.o. 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  Patient states she is chronically short of breath but last 2 days she has been having acutely worsening shortness of breath with cough productive of sputum denies any fever or chills.  Has chronic right-sided chest pain.  Patient was recently placed on oxycodone by patient's oncologist.  ER patient was short of breath had to be placed on BiPAP.  Chest x-ray does not show anything acute.  High sensitive troponins were negative.  BNP is 151.  EKG shows normal sinus rhythm.  COVID test was negative.  Labs show worsening of anemia with hemoglobin of 7.6 and macrocytic picture.  Stool for occult blood was negative.  Patient admitted for acute respiratory failure with hypoxia secondary to COPD.   Assessment & Plan:   Principal Problem:   Acute on chronic respiratory failure with hypoxia (HCC) Active Problems:   Chronic diastolic heart failure (HCC)   Chronic pain syndrome   Small cell carcinoma of lung, right (HCC)   COPD exacerbation (HCC)   Acute respiratory failure with hypoxia (HCC)   Acute on chronic respiratory failure with hypoxia  - on BiPAP secondary to COPD exacerbation.  Patient has been placed on nebulizer Pulmicort and steroids and antibiotics.  We will slowly try to wean off the BiPAP.   -She is currently off BiPAP daytime, still requiring nighttime, will try to wean it off time as well. -She was encouraged with incentive spirometry and flutter valve   History of chronic diastolic CHF   - .Patient appears to be euvolemic currently, no obvious evidence of volume overload  -Continue with home medications .  Small cell lung cancer  -She is being followed by Dr. Bobby Rumpf oncology at [facility] . intolerant to chemo being followed by oncologist.  Oncology notes in the chart mentions that for now they are holding off chemo.  Patient was recently placed on oxycodone and Ativan by oncologist.    Macrocytic anemia -B12 significantly low at 76, will replete. -He is Hemoccult negative . -Hemoglobin 7.1 today, will order 1 unit   B12 deficiency -We will start on IM supplement.  .   Deconditioning Roderic Scarce to thrive -we will consult PT/OT/nutritionist   Anxiety -Continue with home regimen    DVT prophylaxis: Lovenox Code Status: Full Disposition:  Family communication: Discussed with daughter by phone  Status is: Inpatient  Remains inpatient appropriate because:IV treatments appropriate due to intensity of illness or inability to take PO and Inpatient level of care appropriate due to severity of illness  Dispo: The patient is from: Home              Anticipated d/c is to: Home              Patient currently is not medically stable to d/c.   Difficult to place patient No       Consultants:  palliative   Subjective:  Patient reports dyspnea, cough, reports she required BiPAP overnight, reports she is weak. Objective: Vitals:  05/22/21 0401 05/22/21 0530 05/22/21 0730 05/22/21 0803  BP:    (!) 157/92  Pulse:  66  73  Resp:  (!) 5  19  Temp: 97.7 F (36.5 C)   98.1 F (36.7 C)  TempSrc: Axillary   Oral  SpO2:  97% 100% 100%  Weight:      Height:        Intake/Output Summary (Last 24 hours) at 05/22/2021 1159 Last data filed at 05/22/2021 0934 Gross per 24 hour  Intake 240 ml  Output --  Net 240 ml   Filed Weights   05/20/21 2242  Weight: 71.7 kg    Examination:  Awake Alert, Oriented X 3, frail , deconditioned Symmetrical Chest wall  movement, diminished air entry bilaterally with scattered wheezing RRR,No Gallops,Rubs or new Murmurs, No Parasternal Heave +ve B.Sounds, Abd Soft, No tenderness, No rebound - guarding or rigidity. No Cyanosis, Clubbing or edema, No new Rash or bruise      Data Reviewed: I have personally reviewed following labs and imaging studies  CBC: Recent Labs  Lab 05/20/21 2251 05/20/21 2328 05/21/21 0254 05/22/21 0234  WBC 5.3  --  4.8 3.4*  NEUTROABS 3.8  --   --   --   HGB 7.6* 7.5* 7.7* 7.1*  HCT 24.9* 22.0* 25.7* 22.3*  MCV 102.0*  --  103.2* 97.4  PLT 179  --  144* 138*    Basic Metabolic Panel: Recent Labs  Lab 05/20/21 2251 05/20/21 2328 05/21/21 0254 05/22/21 0234  NA 139 139 138 137  K 4.5 4.2 4.5 4.8  CL 104  --  102 103  CO2 28  --  27 29  GLUCOSE 92  --  133* 119*  BUN 21  --  17 21  CREATININE 0.96  --  0.90 1.06*  CALCIUM 8.6*  --  8.8* 8.9    GFR: Estimated Creatinine Clearance: 42.7 mL/min (A) (by C-G formula based on SCr of 1.06 mg/dL (H)).  Liver Function Tests: No results for input(s): AST, ALT, ALKPHOS, BILITOT, PROT, ALBUMIN in the last 168 hours.  CBG: No results for input(s): GLUCAP in the last 168 hours.   Recent Results (from the past 240 hour(s))  Resp Panel by RT-PCR (Flu A&B, Covid) Nasopharyngeal Swab     Status: None   Collection Time: 05/20/21 10:51 PM   Specimen: Nasopharyngeal Swab; Nasopharyngeal(NP) swabs in vial transport medium  Result Value Ref Range Status   SARS Coronavirus 2 by RT PCR NEGATIVE NEGATIVE Final    Comment: (NOTE) SARS-CoV-2 target nucleic acids are NOT DETECTED.  The SARS-CoV-2 RNA is generally detectable in upper respiratory specimens during the acute phase of infection. The lowest concentration of SARS-CoV-2 viral copies this assay can detect is 138 copies/mL. A negative result does not preclude SARS-Cov-2 infection and should not be used as the sole basis for treatment or other patient management  decisions. A negative result may occur with  improper specimen collection/handling, submission of specimen other than nasopharyngeal swab, presence of viral mutation(s) within the areas targeted by this assay, and inadequate number of viral copies(<138 copies/mL). A negative result must be combined with clinical observations, patient history, and epidemiological information. The expected result is Negative.  Fact Sheet for Patients:  EntrepreneurPulse.com.au  Fact Sheet for Healthcare Providers:  IncredibleEmployment.be  This test is no t yet approved or cleared by the [place] FDA and  has been authorized for detection and/or diagnosis of SARS-CoV-2 by FDA under an Emergency Use Authorization (  EUA). This EUA will remain  in effect (meaning this test can be used) for the duration of the COVID-19 declaration under Section 564(b)(1) of the Act, 21 U.S.C.section 360bbb-3(b)(1), unless the authorization is terminated  or revoked sooner.       Influenza A by PCR NEGATIVE NEGATIVE Final   Influenza B by PCR NEGATIVE NEGATIVE Final    Comment: (NOTE) The Xpert Xpress SARS-CoV-2/FLU/RSV plus assay is intended as an aid in the diagnosis of influenza from Nasopharyngeal swab specimens and should not be used as a sole basis for treatment. Nasal washings and aspirates are unacceptable for Xpert Xpress SARS-CoV-2/FLU/RSV testing.  Fact Sheet for Patients: EntrepreneurPulse.com.au  Fact Sheet for Healthcare Providers: IncredibleEmployment.be  This test is not yet approved or cleared by the [place] FDA and has been authorized for detection and/or diagnosis of SARS-CoV-2 by FDA under an Emergency Use Authorization (EUA). This EUA will remain in effect (meaning this test can be used) for the duration of the COVID-19 declaration under Section 564(b)(1) of the Act, 21 U.S.C. section 360bbb-3(b)(1), unless the  authorization is terminated or revoked.  Performed at [facility], [place] [address], [place], [place] [address]          Radiology Studies: DG Chest Port 1 View  Result Date: 05/20/2021 CLINICAL DATA:  Shortness of breath EXAM: PORTABLE CHEST 1 VIEW COMPARISON:  05/11/2021 FINDINGS: Right Port-A-Cath remains in place, unchanged. Scarring at the left base. Right lung clear. Heart is normal size. No effusions or acute bony abnormality. IMPRESSION: Left basilar scarring. No active disease. Electronically Signed   By: Rolm Baptise M.D.   On: 05/20/2021 22:53        Scheduled Meds:  sodium chloride   Intravenous Once   aspirin EC  81 mg Oral Daily   azithromycin  500 mg Oral Daily   budesonide (PULMICORT) nebulizer solution  0.25 mg Nebulization BID   buPROPion  300 mg Oral Daily   enoxaparin (LOVENOX) injection  40 mg Subcutaneous Q24H   furosemide  20 mg Oral Daily   gabapentin  800 mg Oral TID   ipratropium-albuterol  3 mL Nebulization TID   methylPREDNISolone (SOLU-MEDROL) injection  40 mg Intravenous Q12H   primidone  50 mg Oral QHS   sertraline  25 mg Oral Daily   spironolactone  25 mg Oral Daily   Continuous Infusions:   LOS: 1 day     Phillips Climes, MD [facility]   To contact the attending provider between 7A-7P or the covering provider during after hours 7P-7A, please log into the web site [email or web address] and access using universal [place] password for that web site. If you do not have the password, please call the hospital operator.  05/22/2021, 11:59 AM

## 2021-05-23 ENCOUNTER — Other Ambulatory Visit: Payer: Self-pay

## 2021-05-23 DIAGNOSIS — R531 Weakness: Secondary | ICD-10-CM

## 2021-05-23 DIAGNOSIS — J9621 Acute and chronic respiratory failure with hypoxia: Secondary | ICD-10-CM | POA: Diagnosis not present

## 2021-05-23 DIAGNOSIS — I5032 Chronic diastolic (congestive) heart failure: Secondary | ICD-10-CM | POA: Diagnosis not present

## 2021-05-23 DIAGNOSIS — J441 Chronic obstructive pulmonary disease with (acute) exacerbation: Secondary | ICD-10-CM | POA: Diagnosis not present

## 2021-05-23 DIAGNOSIS — G894 Chronic pain syndrome: Secondary | ICD-10-CM | POA: Diagnosis not present

## 2021-05-23 DIAGNOSIS — R52 Pain, unspecified: Secondary | ICD-10-CM

## 2021-05-23 LAB — BPAM RBC
Blood Product Expiration Date: 202209282359
ISSUE DATE / TIME: 202208271427
Unit Type and Rh: 5100

## 2021-05-23 LAB — TYPE AND SCREEN
ABO/RH(D): O POS
Antibody Screen: NEGATIVE
Unit division: 0

## 2021-05-23 LAB — CBC
HCT: 26.5 % — ABNORMAL LOW (ref 36.0–46.0)
Hemoglobin: 8.6 g/dL — ABNORMAL LOW (ref 12.0–15.0)
MCH: 30.9 pg (ref 26.0–34.0)
MCHC: 32.5 g/dL (ref 30.0–36.0)
MCV: 95.3 fL (ref 80.0–100.0)
Platelets: 127 10*3/uL — ABNORMAL LOW (ref 150–400)
RBC: 2.78 MIL/uL — ABNORMAL LOW (ref 3.87–5.11)
RDW: 16.6 % — ABNORMAL HIGH (ref 11.5–15.5)
WBC: 6.8 10*3/uL (ref 4.0–10.5)
nRBC: 0 % (ref 0.0–0.2)

## 2021-05-23 LAB — BASIC METABOLIC PANEL
Anion gap: 8 (ref 5–15)
BUN: 22 mg/dL (ref 8–23)
CO2: 29 mmol/L (ref 22–32)
Calcium: 8.9 mg/dL (ref 8.9–10.3)
Chloride: 98 mmol/L (ref 98–111)
Creatinine, Ser: 1.01 mg/dL — ABNORMAL HIGH (ref 0.44–1.00)
GFR, Estimated: 57 mL/min — ABNORMAL LOW (ref >60)
Glucose, Bld: 132 mg/dL — ABNORMAL HIGH (ref 70–99)
Potassium: 4.5 mmol/L (ref 3.5–5.1)
Sodium: 135 mmol/L (ref 135–145)

## 2021-05-23 MED ORDER — POLYETHYLENE GLYCOL 3350 17 G PO PACK: 17.0000 g | PACK | Freq: Every day | ORAL | Status: DC | PRN

## 2021-05-23 MED ORDER — OXYCODONE HCL 5 MG PO TABS
10.0000 mg | ORAL_TABLET | ORAL | Status: DC | PRN
  Administered 2021-05-23: 10 mg via ORAL
  Filled 2021-05-23: qty 2

## 2021-05-23 MED ORDER — OXYCODONE HCL 5 MG PO TABS
10.0000 mg | ORAL_TABLET | Freq: Four times a day (QID) | ORAL | Status: DC | PRN
  Administered 2021-05-24 – 2021-05-25 (×3): 10 mg via ORAL
  Filled 2021-05-23 (×3): qty 2

## 2021-05-23 MED ORDER — PANTOPRAZOLE SODIUM 40 MG PO TBEC
40.0000 mg | DELAYED_RELEASE_TABLET | Freq: Every day | ORAL | Status: DC
  Administered 2021-05-23 – 2021-05-25 (×3): 40 mg via ORAL
  Filled 2021-05-23 (×3): qty 1

## 2021-05-23 NOTE — Progress Notes (Addendum)
Daily Progress Note   Patient Name: Stephanie Garza       Date: 05/23/2021 DOB: 08-15-1941  Age: 80 y.o. MRN#: [id number] Attending Physician: Albertine Patricia, MD Primary Care Physician: Zoila Shutter, NP Admit Date: 05/20/2021  Reason for Consultation/Follow-up: Disposition, Establishing goals of care, and Pain control  Subjective: Chart review performed. Received report from primary RN - no acute concerns.   Went to visit patient at bedside - no family/visitors present, but daughter/[name] was on speakerphone. Patient was sitting up in chair awake, alert, oriented, and able to participate in conversation. Patient states she "feels alright" today - feels her breathing is better today than yesterday. Encouraged use of spirometer. Ms. Maddux states she has started to cough up more sputum. No signs or non-verbal gestures of pain or discomfort noted. No respiratory distress, increased work of breathing; congested cough noted. She is on 4L O2 [place].  Reviewed oxycodone medication changes yesterday - patient requests to go back on her other q6 PRN meds. Explained long acting vs short acting medication in context of dose and how it was the same. We discussed the use of her 5mg  PRN dose - encouraged to utilize if needed as she has only used it once. She explained 5mg  did not help her much - she is agreeable to increase to 10mg  today. Can adjust long acting dose based on PRN use. She is agreeable to try this today and will reassess tomorrow 8/29.  Discussed how working with PT went for patient yesterday - she states it went well. She remains motivated to continue rehabilitation in hopes to regain strength to continue chemo. We discussed their recommendation of [facility], which she is agreeable to. She  specifically requests/would like to go to an outpatient facility for PT if possible, but would need public transportation.   [name] asks questions about patient's lab work - reviewed labs as well as medical progress notes.  All questions and concerns addressed. Encouraged to call with questions and/or concerns. PMT card/number provided.   Length of Stay: 2  Current Medications: Scheduled Meds:   aspirin EC  81 mg Oral Daily   azithromycin  500 mg Oral Daily   budesonide (PULMICORT) nebulizer solution  0.25 mg Nebulization BID   buPROPion  300 mg Oral Daily   cyanocobalamin  1,000  mcg Intramuscular Daily   Followed by   Derrill Memo ON 05/29/2021] cyanocobalamin  1,000 mcg Intramuscular Weekly   furosemide  20 mg Oral Daily   gabapentin  800 mg Oral TID   ipratropium-albuterol  3 mL Nebulization TID   LORazepam  1.5 mg Oral Q8H   methylPREDNISolone (SOLU-MEDROL) injection  40 mg Intravenous Q12H   oxyCODONE  30 mg Oral Q12H   pantoprazole  40 mg Oral Daily   primidone  50 mg Oral QHS   sertraline  25 mg Oral Daily   spironolactone  25 mg Oral Daily    Continuous Infusions:   PRN Meds: acetaminophen **OR** acetaminophen, albuterol, dicyclomine, oxyCODONE  Physical Exam Vitals and nursing note reviewed.  Constitutional:      General: She is not in acute distress. Pulmonary:     Effort: No respiratory distress.     Comments: Congested cough Skin:    General: Skin is warm and dry.  Neurological:     Mental Status: She is alert and oriented to person, place, and time.     Motor: Weakness present.  Psychiatric:        Attention and Perception: Attention normal.        Behavior: Behavior is cooperative.        Cognition and Memory: Cognition and memory normal.            Vital Signs: BP (!) 137/56 (BP Location: Right Arm)   Pulse 66   Temp 97.6 F (36.4 C) (Axillary)   Resp (!) 26   Ht 5\' 5"  (1.651 m)   Wt 71.7 kg   SpO2 100%   BMI 26.29 kg/m  SpO2: SpO2: 100 % O2  Device: O2 Device: Nasal Cannula O2 Flow Rate: O2 Flow Rate (L/min): 4 L/min  Intake/output summary:  Intake/Output Summary (Last 24 hours) at 05/23/2021 1040 Last data filed at 05/23/2021 1000 Gross per 24 hour  Intake 768 ml  Output 200 ml  Net 568 ml   LBM: Last BM Date: 05/22/21 Baseline Weight: Weight: 71.7 kg Most recent weight: Weight: 71.7 kg       Palliative Assessment/Data: PPS 60%    Flowsheet Rows    Flowsheet Row Most Recent Value  Intake Tab   Referral Department Hospitalist  Unit at Time of Referral ER  Palliative Care Primary Diagnosis Pulmonary  Date Notified 05/21/21  Palliative Care Type New Palliative care  Reason for referral Clarify Goals of Care  Date of Admission 05/20/21  Date first seen by Palliative Care 05/22/21  # of days Palliative referral response time 1 Day(s)  # of days IP prior to Palliative referral 1  Clinical Assessment   Psychosocial & Spiritual Assessment   Palliative Care Outcomes   Patient/Family meeting held? Yes  Who was at the meeting? patient, daughter  Palliative Care Outcomes Improved pain interventions, Improved non-pain symptom therapy, Clarified goals of care, Counseled regarding hospice, Provided psychosocial or spiritual support, Linked to palliative care logitudinal support       Patient Active Problem List   Diagnosis Date Noted   COPD exacerbation ([place]) 05/21/2021   Acute on chronic respiratory failure with hypoxia ([place]) 05/21/2021   Acute respiratory failure with hypoxia ([place]) 05/21/2021   Small cell carcinoma of lung, right ([place]) 02/19/2021   Abnormal gait 02/18/2021   Chronic diastolic heart failure ([place]) 02/18/2021   Chronic obstructive pulmonary disease ([place]) 02/18/2021   Chronic pain syndrome 02/18/2021   Essential tremor 02/18/2021   Gastro-esophageal reflux disease without  esophagitis 02/18/2021   Generalized anxiety disorder 02/18/2021   Hereditary and idiopathic neuropathy, unspecified 02/18/2021    OBESITY, NOS 11/23/2006   ANXIETY 11/23/2006   TOBACCO DEPENDENCE 11/23/2006   DEPRESSIVE DISORDER, NOS 11/23/2006   CATARACT 11/23/2006   HYPERTENSION, BENIGN SYSTEMIC 11/23/2006   EDEMA-LEGS,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 female  with past medical history of COPD on home oxygen 3 L, chronic diastolic CHF, and recent diagnosis of small cell lung cancer presented to the ED on 05/20/21 from home with complaints of worsening shortness of breath x2 days with productive cough. Patient was admitted on 05/20/2021 with acute on chronic respiratory failure with hypoxia secondary to COPD exacerbation, small cell lung cancer.    ED Course: In the ER patient was short of breath had to be placed on BiPAP.  Chest x-ray does not show anything acute.  High sensitive troponins were negative.  BNP is 151.  EKG shows normal sinus rhythm.  COVID test was negative.  Labs show worsening of anemia with hemoglobin of 7.6 and macrocytic picture.  Stool for occult blood was negative.  Patient admitted for acute respiratory failure with hypoxia secondary to COPD.  Assessment: Acute on chronic respiratory failure with hypoxia Chronic diastolic heart failure Chronic pain syndrome Small cell lung carcinoma COPD exacerbation  Recommendations/Plan: Continue full scope medical treatment Continue full code status Patient's goal is to regain strength to be able to continue chemotherapy. She is agreeable to HHPT - specifically requests to be seen at an outpatient rehab facility rather than someone come to her home. She would need public transportation set up with this option Patient and family would like a second opinion with [facility] oncology Increased oxycodone from 5mg  to 10mg  PO q6h PRN for breakthrough pain Miralax daily PRN constipation Please ensure outpatient  Palliative Care is set up for patient before discharge - TOC consult previously placed PMT will continue to follow and support holistically  Goals of Care and Additional Recommendations: Limitations on Scope of Treatment: Full Scope Treatment  Code Status:    Code Status Orders  (From admission, onward)           Start     Ordered   05/21/21 0614  Full code  Continuous        05/21/21 0613           Code Status History     Date Active Date Inactive Code Status Order ID Comments User Context   05/21/2021 0220 05/21/2021 0612 DNR [id number]  Rise Patience, MD ED      Advance Directive Documentation    Flowsheet Row Most Recent Value  Type of Advance Directive Healthcare Power of Attorney  Pre-existing out of facility DNR order (yellow form or pink MOST form) --  "MOST" Form in Place? --       Prognosis:  Unable to determine  Discharge Planning: Home with Palliative Services with [facility] was discussed with primary RN, patient, patient's daughter, TOC, PT  Thank you for allowing the Palliative Medicine Team to assist in the care of this patient.   Total Time 40 minutes Prolonged Time Billed  no       Greater than 50%  of this time was spent counseling and coordinating care related to the above assessment and plan.  Lin Landsman, NP  Please contact Palliative Medicine Team phone at [phone] for questions and concerns.

## 2021-05-23 NOTE — TOC Initial Note (Addendum)
Transition of Care [facility]) - Initial/Assessment Note    Patient Details  Name: Stephanie Garza MRN: [id number] Date of Birth: September 24, 1941  [facility] CM/SW Contact:    Carles Collet, RN Phone Number: 05/23/2021, 1:06 PM  Clinical Narrative:      Damaris Schooner w patient at bedside. She declines [facility] services at this time due to her dogs at home. She states that she would prefer to have outpatient PT and her and her daughter will facilitate assistance she would need for transportation.  Patient is agreeable to outpatient palliative referral with [facility] and will need referral placed prior to discharge.  Patient has home O2 at 3-4L, she cannot remember name of company she uses.             Expected Discharge Plan: Home/Self Care Barriers to Discharge: Continued Medical Work up   Patient Goals and CMS Choice Patient states their goals for this hospitalization and ongoing recovery are:: to go home      Expected Discharge Plan and Services Expected Discharge Plan: Home/Self Care                                              Prior Living Arrangements/Services                       Activities of Daily Living Home Assistive Devices/Equipment: Environmental consultant (specify type), Oxygen ADL Screening (condition at time of admission) Patient's cognitive ability adequate to safely complete daily activities?: Yes Is the patient deaf or have difficulty hearing?: No Does the patient have difficulty seeing, even when wearing glasses/contacts?: No Does the patient have difficulty concentrating, remembering, or making decisions?: No Patient able to express need for assistance with ADLs?: Yes Does the patient have difficulty dressing or bathing?: No Independently performs ADLs?: Yes (appropriate for developmental age) Does the patient have difficulty walking or climbing stairs?: Yes Weakness of Legs: Both Weakness of Arms/Hands: None  Permission Sought/Granted                   Emotional Assessment              Admission diagnosis:  COPD exacerbation ([place]) [J44.1] Acute respiratory failure with hypoxia ([place]) [J96.01] Patient Active Problem List   Diagnosis Date Noted   COPD exacerbation ([place]) 05/21/2021   Acute on chronic respiratory failure with hypoxia ([place]) 05/21/2021   Acute respiratory failure with hypoxia ([place]) 05/21/2021   Small cell carcinoma of lung, right ([place]) 02/19/2021   Abnormal gait 02/18/2021   Chronic diastolic heart failure ([place]) 02/18/2021   Chronic obstructive pulmonary disease ([place]) 02/18/2021   Chronic pain syndrome 02/18/2021   Essential tremor 02/18/2021   Gastro-esophageal reflux disease without esophagitis 02/18/2021   Generalized anxiety disorder 02/18/2021   Hereditary and idiopathic neuropathy, unspecified 02/18/2021   OBESITY, NOS 11/23/2006   ANXIETY 11/23/2006   TOBACCO DEPENDENCE 11/23/2006   DEPRESSIVE DISORDER, NOS 11/23/2006   CATARACT 11/23/2006   HYPERTENSION, BENIGN SYSTEMIC 11/23/2006   EDEMA-LEGS,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 [address] Phone: [phone] Fax: [phone]     Social Determinants of  Health (SDOH) Interventions    Readmission Risk Interventions No flowsheet data found.

## 2021-05-23 NOTE — Progress Notes (Signed)
PROGRESS NOTE    Stephanie Garza  HGD:[id number] DOB: [birth year] DOA: 05/20/2021 PCP: Zoila Shutter, NP   Chief Complaint  Patient presents with   Shortness of Breath    Brief Narrative:   Stephanie Garza is a 80 y.o. 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  Patient states she is chronically short of breath but last 2 days she has been having acutely worsening shortness of breath with cough productive of sputum denies any fever or chills.  Has chronic right-sided chest pain.  Patient was recently placed on oxycodone by patient's oncologist.  ER patient was short of breath had to be placed on BiPAP.  Chest x-ray does not show anything acute.  High sensitive troponins were negative.  BNP is 151.  EKG shows normal sinus rhythm.  COVID test was negative.  Labs show worsening of anemia with hemoglobin of 7.6 and macrocytic picture.  Stool for occult blood was negative.  Patient admitted for acute respiratory failure with hypoxia secondary to COPD.   Assessment & Plan:   Principal Problem:   Acute on chronic respiratory failure with hypoxia (HCC) Active Problems:   Chronic diastolic heart failure (HCC)   Chronic pain syndrome   Small cell carcinoma of lung, right (HCC)   COPD exacerbation (HCC)   Acute respiratory failure with hypoxia (HCC)   Acute on chronic respiratory failure with hypoxia  - on BiPAP secondary to COPD exacerbation.   -SHE Did not require BiPAP overnight. -Continue to wean steroids, continue with azithromycin -[place] upper airway congestion, she will be started on scheduled Mucinex, she was encouraged to use incentive spirometer and flutter valve.  History of chronic diastolic CHF  - .Patient appears to be euvolemic currently, no  obvious evidence of volume overload  -Continue with home medications .  Small cell lung cancer  -She is being followed by Dr. Bobby Rumpf oncology at [facility] . intolerant to chemo being followed by oncologist.  Oncology notes in the chart mentions that for now they are holding off chemo.  Patient was recently placed on oxycodone and Ativan by oncologist.    Macrocytic anemia -B12 significantly low at 76, will replete. -He is Hemoccult negative . -Hemoglobin 7.1 on 8/27, she was transfused 1 unit PRBC, good response hemoglobin is 8.6 today.  B12 deficiency -We will start on IM supplement, can be transitioned to oral on discharge.   Deconditioning /failure to thrive -we will consult PT/OT/nutritionist   Anxiety -Continue with home regimen   DVT prophylaxis: Lovenox Code Status: Full Disposition:  Family communication: Discussed with daughter by phone 8/27  Status is: Inpatient  Remains inpatient appropriate because:IV treatments appropriate due to intensity of illness or inability to take PO and Inpatient level of care appropriate due to severity of illness  Dispo: The patient is from: Home              Anticipated d/c is to: Home              Patient currently is not medically stable to d/c.   Difficult to place patient No       Consultants:  palliative   Subjective:  Patient reports she is feeling better today, dyspnea has improved. Objective: Vitals:   05/22/21 2354 05/23/21 0352 05/23/21  0734 05/23/21 0800  BP:  (!) 137/56    Pulse:  66    Resp:      Temp: 98.1 F (36.7 C) 97.6 F (36.4 C)  97.6 F (36.4 C)  TempSrc: Oral Oral  Axillary  SpO2: 98% 98% 100%   Weight:      Height:        Intake/Output Summary (Last 24 hours) at 05/23/2021 1402 Last data filed at 05/23/2021 1000 Gross per 24 hour  Intake 768 ml  Output 200 ml  Net 568 ml   Filed Weights   05/20/21 2242  Weight: 71.7 kg    Examination:  Awake Alert, Oriented X 3, frail,  deconditioned. Symmetrical Chest wall movement, good air entry today, improved wheezing RRR,No Gallops,Rubs or new Murmurs, No Parasternal Heave +ve B.Sounds, Abd Soft, No tenderness, No rebound - guarding or rigidity. No Cyanosis, Clubbing or edema, No new Rash or bruise      Data Reviewed: I have personally reviewed following labs and imaging studies  CBC: Recent Labs  Lab 05/20/21 2251 05/20/21 2328 05/21/21 0254 05/22/21 0234 05/22/21 2050 05/23/21 0336  WBC 5.3  --  4.8 3.4*  --  6.8  NEUTROABS 3.8  --   --   --   --   --   HGB 7.6* 7.5* 7.7* 7.1* 9.3* 8.6*  HCT 24.9* 22.0* 25.7* 22.3* 28.5* 26.5*  MCV 102.0*  --  103.2* 97.4  --  95.3  PLT 179  --  144* 138*  --  127*    Basic Metabolic Panel: Recent Labs  Lab 05/20/21 2251 05/20/21 2328 05/21/21 0254 05/22/21 0234 05/23/21 0336  NA 139 139 138 137 135  K 4.5 4.2 4.5 4.8 4.5  CL 104  --  102 103 98  CO2 28  --  27 29 29   GLUCOSE 92  --  133* 119* 132*  BUN 21  --  17 21 22   CREATININE 0.96  --  0.90 1.06* 1.01*  CALCIUM 8.6*  --  8.8* 8.9 8.9    GFR: Estimated Creatinine Clearance: 44.8 mL/min (A) (by C-G formula based on SCr of 1.01 mg/dL (H)).  Liver Function Tests: No results for input(s): AST, ALT, ALKPHOS, BILITOT, PROT, ALBUMIN in the last 168 hours.  CBG: No results for input(s): GLUCAP in the last 168 hours.   Recent Results (from the past 240 hour(s))  Resp Panel by RT-PCR (Flu A&B, Covid) Nasopharyngeal Swab     Status: None   Collection Time: 05/20/21 10:51 PM   Specimen: Nasopharyngeal Swab; Nasopharyngeal(NP) swabs in vial transport medium  Result Value Ref Range Status   SARS Coronavirus 2 by RT PCR NEGATIVE NEGATIVE Final    Comment: (NOTE) SARS-CoV-2 target nucleic acids are NOT DETECTED.  The SARS-CoV-2 RNA is generally detectable in upper respiratory specimens during the acute phase of infection. The lowest concentration of SARS-CoV-2 viral copies this assay can detect is 138  copies/mL. A negative result does not preclude SARS-Cov-2 infection and should not be used as the sole basis for treatment or other patient management decisions. A negative result may occur with  improper specimen collection/handling, submission of specimen other than nasopharyngeal swab, presence of viral mutation(s) within the areas targeted by this assay, and inadequate number of viral copies(<138 copies/mL). A negative result must be combined with clinical observations, patient history, and epidemiological information. The expected result is Negative.  Fact Sheet for Patients:  EntrepreneurPulse.com.au  Fact Sheet for Healthcare Providers:  IncredibleEmployment.be  This test is no t yet approved or cleared by the [place] and  has been authorized for detection and/or diagnosis of SARS-CoV-2 by FDA under an Emergency Use Authorization (EUA). This EUA will remain  in effect (meaning this test can be used) for the duration of the COVID-19 declaration under Section 564(b)(1) of the Act, 21 U.S.C.section 360bbb-3(b)(1), unless the authorization is terminated  or revoked sooner.       Influenza A by PCR NEGATIVE NEGATIVE Final   Influenza B by PCR NEGATIVE NEGATIVE Final    Comment: (NOTE) The Xpert Xpress SARS-CoV-2/FLU/RSV plus assay is intended as an aid in the diagnosis of influenza from Nasopharyngeal swab specimens and should not be used as a sole basis for treatment. Nasal washings and aspirates are unacceptable for Xpert Xpress SARS-CoV-2/FLU/RSV testing.  Fact Sheet for Patients: EntrepreneurPulse.com.au  Fact Sheet for Healthcare Providers: IncredibleEmployment.be  This test is not yet approved or cleared by the [place] FDA and has been authorized for detection and/or diagnosis of SARS-CoV-2 by FDA under an Emergency Use Authorization (EUA). This EUA will remain in effect (meaning  this test can be used) for the duration of the COVID-19 declaration under Section 564(b)(1) of the Act, 21 U.S.C. section 360bbb-3(b)(1), unless the authorization is terminated or revoked.  Performed at [facility], [place] [address] [place], [place] [address]          Radiology Studies: No results found.      Scheduled Meds:  aspirin EC  81 mg Oral Daily   azithromycin  500 mg Oral Daily   budesonide (PULMICORT) nebulizer solution  0.25 mg Nebulization BID   buPROPion  300 mg Oral Daily   cyanocobalamin  1,000 mcg Intramuscular Daily   Followed by   Derrill Memo ON 05/29/2021] cyanocobalamin  1,000 mcg Intramuscular Weekly   furosemide  20 mg Oral Daily   gabapentin  800 mg Oral TID   ipratropium-albuterol  3 mL Nebulization TID   LORazepam  1.5 mg Oral Q8H   methylPREDNISolone (SOLU-MEDROL) injection  40 mg Intravenous Q12H   oxyCODONE  30 mg Oral Q12H   pantoprazole  40 mg Oral Daily   primidone  50 mg Oral QHS   sertraline  25 mg Oral Daily   spironolactone  25 mg Oral Daily   Continuous Infusions:   LOS: 2 days     Phillips Climes, MD [facility]   To contact the attending provider between 7A-7P or the covering provider during after hours 7P-7A, please log into the web site [email or web address] and access using universal [place] password for that web site. If you do not have the password, please call the hospital operator.  05/23/2021, 2:02 PM

## 2021-05-23 NOTE — Evaluation (Signed)
Occupational Therapy Evaluation Patient Details Name: Stephanie Garza MRN: [id number] DOB: [birth year] Today's Date: 05/23/2021    History of Present Illness 80 y.o. female presents to [facility] hospital on 05/21/2021 with worsening SOB. Pt admitted for acute on chronic respiratory failure with hypoxia. PMH includes COPD, CHF, small cell lung cancer.   Clinical Impression    Pt PTA: pt living with family and reports to have set-upA for most ADL, but has assist PRN. Pt currently performing OOB ADL in standing x2 mins with minA overall for LB ADL (close to her functional baseline). pt reports that her 31 yo grandson stays home in order to assist her with all functional tasks since her dx of cancer. O2 >90% on 5-6L O2. Pt using pursed lip breathing techniques and seated rest breaks. Education provided for energy conservation and pt reports to be using techniques at home and family "doesn't allow me to do too much anyway." Pt does not require continued OT skilled services as pt aware of energy conservation techniques and plans to has assist at home for ADL. Pt getting to recliner each day and ambulating short distances. OT signing off, thank you.    Follow Up Recommendations  No OT follow up;Supervision - Intermittent    Equipment Recommendations  None recommended by OT    Recommendations for Other Services       Precautions / Restrictions Precautions Precautions: Fall Precaution Comments: O2 at baseline Restrictions Weight Bearing Restrictions: No      Mobility Bed Mobility               General bed mobility comments: out of bed in recliner    Transfers Overall transfer level: Needs assistance Equipment used: Rolling walker (2 wheeled) Transfers: Sit to/from [facility] Sit to Stand: Supervision Stand pivot transfers: Min guard       General transfer comment: supervisionA with RW and minguardA for hand placement and to avoid plopping    Balance Overall  balance assessment: Needs assistance Sitting-balance support: Feet supported;No upper extremity supported Sitting balance-Leahy Scale: Fair     Standing balance support: No upper extremity supported Standing balance-Leahy Scale: Fair                             ADL either performed or assessed with clinical judgement   ADL Overall ADL's : Needs assistance/impaired Eating/Feeding: Supervision/ safety;Sitting   Grooming: Supervision/safety;Standing Grooming Details (indicate cue type and reason): standing x2 mins Upper Body Bathing: Set up;Sitting   Lower Body Bathing: Minimal assistance;Sitting/lateral leans;Sit to/from stand Lower Body Bathing Details (indicate cue type and reason): has assist here and reports her grandson is very helpful at home Upper Body Dressing : Set up;Sitting   Lower Body Dressing: Minimal assistance;Sitting/lateral leans;Sit to/from stand Lower Body Dressing Details (indicate cue type and reason): has assist here and reports her grandson is very helpful at home Toilet Transfer: [place] guard;Cueing for Office manager Details (indicate cue type and reason): with RW to [place]- Clothing Manipulation and Hygiene: Minimal assistance;Sitting/lateral lean;Sit to/from stand;Cueing for safety       Functional mobility during ADLs: Min guard;Rolling walker;Cueing for safety General ADL Comments: Pt performing OOB ADL in standing x2 mins with minA overall for LB ADL.     Vision Baseline Vision/History: 1 Wears glasses Ability to See in Adequate Light: 0 Adequate Patient Visual Report: No change from baseline Vision Assessment?: No apparent visual deficits  Perception     Praxis      Pertinent Vitals/Pain Pain Assessment: 0-10 Pain Score: 6  Pain Location: "all over"     Hand Dominance Right   Extremity/Trunk Assessment Upper Extremity Assessment Upper Extremity Assessment: Generalized weakness LUE Deficits / Details: LUE  shoulder ROM poor  > RUE   Lower Extremity Assessment Lower Extremity Assessment: Generalized weakness   Cervical / Trunk Assessment Cervical / Trunk Assessment: Kyphotic   Communication Communication Communication: No difficulties   Cognition Arousal/Alertness: Awake/alert Behavior During Therapy: WFL for tasks assessed/performed Overall Cognitive Status: Within Functional Limits for tasks assessed                                     General Comments  O2 >90% on 5-6L O2. Pt using pursed lip breathing techniques and seated rest breaks. Education provided for energy conservation and pt reports to be using techniques at home and family "doesn't allow me to do too much anyway."    Exercises     Shoulder Instructions      Home Living Family/patient expects to be discharged to:: Private residence Living Arrangements: Children;Other relatives Available Help at Discharge: Family;Available 24 hours/day Type of Home: Mobile home Home Access: Ramped entrance     Home Layout: One level     Bathroom Shower/Tub: Teacher, early years/pre: Standard Bathroom Accessibility: Yes   Home Equipment: Walker - 2 wheels;Walker - 4 wheels;Cane - single point;Bedside commode;Shower seat;Tub bench          Prior Functioning/Environment Level of Independence: Needs assistance  Gait / Transfers Assistance Needed: pt reports ambulating with use of walker for household distances. grandson assists with managing oxygen tubing ADL's / Homemaking Assistance Needed: requires assistance with dressing, otherwise independent with ADLs, assistance required for IADLs            OT Problem List: Decreased activity tolerance      OT Treatment/Interventions:      OT Goals(Current goals can be found in the care plan section) Acute Rehab OT Goals Patient Stated Goal: to cook again and tolerate walking into grocery store OT Goal Formulation: With patient Time For Goal  Achievement: 06/06/21 Potential to Achieve Goals: Good  OT Frequency:     Barriers to D/C:            Co-evaluation              AM-PAC OT "6 Clicks" Daily Activity     Outcome Measure Help from another person eating meals?: None Help from another person taking care of personal grooming?: A Little Help from another person toileting, which includes using toliet, bedpan, or urinal?: A Little Help from another person bathing (including washing, rinsing, drying)?: A Little Help from another person to put on and taking off regular upper body clothing?: None Help from another person to put on and taking off regular lower body clothing?: A Little 6 Click Score: 20   End of Session Equipment Utilized During Treatment: Gait belt;Rolling walker;Oxygen Nurse Communication: Mobility status  Activity Tolerance: Patient limited by fatigue Patient left: in chair;with call bell/phone within reach  OT Visit Diagnosis: Unsteadiness on feet (R26.81);Muscle weakness (generalized) (M62.81)                Time: 5009-3818 OT Time Calculation (min): 21 min Charges:  OT General Charges $OT Visit: 1 Visit OT Evaluation $OT Eval Moderate Complexity:  [facility], OTR/L Acute Rehabilitation Services Pager: [phone] Office: [place]  C 05/23/2021, 3:14 PM

## 2021-05-24 DIAGNOSIS — J441 Chronic obstructive pulmonary disease with (acute) exacerbation: Secondary | ICD-10-CM | POA: Diagnosis not present

## 2021-05-24 DIAGNOSIS — C3491 Malignant neoplasm of unspecified part of right bronchus or lung: Secondary | ICD-10-CM | POA: Diagnosis not present

## 2021-05-24 DIAGNOSIS — J9621 Acute and chronic respiratory failure with hypoxia: Secondary | ICD-10-CM | POA: Diagnosis not present

## 2021-05-24 DIAGNOSIS — G894 Chronic pain syndrome: Secondary | ICD-10-CM | POA: Diagnosis not present

## 2021-05-24 LAB — CBC
HCT: 27.1 % — ABNORMAL LOW (ref 36.0–46.0)
Hemoglobin: 8.4 g/dL — ABNORMAL LOW (ref 12.0–15.0)
MCH: 29.9 pg (ref 26.0–34.0)
MCHC: 31 g/dL (ref 30.0–36.0)
MCV: 96.4 fL (ref 80.0–100.0)
Platelets: 131 10*3/uL — ABNORMAL LOW (ref 150–400)
RBC: 2.81 MIL/uL — ABNORMAL LOW (ref 3.87–5.11)
RDW: 16.1 % — ABNORMAL HIGH (ref 11.5–15.5)
WBC: 5.8 10*3/uL (ref 4.0–10.5)
nRBC: 0 % (ref 0.0–0.2)

## 2021-05-24 LAB — BASIC METABOLIC PANEL
Anion gap: 7 (ref 5–15)
BUN: 25 mg/dL — ABNORMAL HIGH (ref 8–23)
CO2: 32 mmol/L (ref 22–32)
Calcium: 8.5 mg/dL — ABNORMAL LOW (ref 8.9–10.3)
Chloride: 99 mmol/L (ref 98–111)
Creatinine, Ser: 1.12 mg/dL — ABNORMAL HIGH (ref 0.44–1.00)
GFR, Estimated: 50 mL/min — ABNORMAL LOW (ref >60)
Glucose, Bld: 87 mg/dL (ref 70–99)
Potassium: 3.9 mmol/L (ref 3.5–5.1)
Sodium: 138 mmol/L (ref 135–145)

## 2021-05-24 MED ORDER — ADULT MULTIVITAMIN W/MINERALS CH
1.0000 | ORAL_TABLET | Freq: Every day | ORAL | Status: DC
  Administered 2021-05-24 – 2021-05-25 (×2): 1 via ORAL
  Filled 2021-05-24 (×2): qty 1

## 2021-05-24 MED ORDER — ENSURE ENLIVE PO LIQD
237.0000 mL | Freq: Three times a day (TID) | ORAL | Status: DC
  Administered 2021-05-25 (×2): 237 mL via ORAL

## 2021-05-24 MED ORDER — ENSURE ENLIVE PO LIQD: 237.0000 mL | Freq: Two times a day (BID) | ORAL | Status: DC

## 2021-05-24 NOTE — Progress Notes (Signed)
Initial Nutrition Assessment  DOCUMENTATION CODES:   Severe malnutrition in context of chronic illness  INTERVENTION:  -Ensure Enlive po TID, each supplement provides 350 kcal and 20 grams of protein -Magic cup BID with meals, each supplement provides 290 kcal and 9 grams of protein -MVI with minerals daily  NUTRITION DIAGNOSIS:   Severe Malnutrition related to chronic illness (cancer) as evidenced by percent weight loss, severe muscle depletion, severe fat depletion, energy intake < or equal to 75% for > or equal to 1 month.  GOAL:   Patient will meet greater than or equal to 90% of their needs  MONITOR:   PO intake, Supplement acceptance, Labs, Weight trends, I & O's  REASON FOR ASSESSMENT:   Consult Assessment of nutrition requirement/status  ASSESSMENT:   Pt with PMH significant for COPD on 3L O2 at baseline, CHF, and small cell lung Ca intolerant to chemotherapy admitted for acute respiratory failure with hypoxia secondary to COPD.  Pt reports poor appetite for several months and states that she has been eating 1 meal per day on average. When asked for details about the meals she eats, pt stated that it really varies on how she is feeling. Pt states that she is determined to "get back on track" with nutrition and eat more consistently. Pt states that she recently began drinking Ensure at home and would like to continue receiving them while admitted, agreeable to TID. Also agreeable to trying [employer].   PO Intake: 70-100% x 3 recorded meals (90% average meal intake)  Reviewed weight history. Pt weighed 79kg on 02/18/21 and now weighs 71.7 kg. This indicates a clinically significant 9.24% weight loss over 3 months.   Medications: vitamin B12, lasix, solu-medrol, protonix, aldactone Labs: BUN 25 (H), Cr 1.12 (H)  UOP: 671ml x24 hours I/O: +843ml since admit  NUTRITION - FOCUSED PHYSICAL EXAM:  Flowsheet Row Most Recent Value  Orbital Region Severe depletion  Upper  Arm Region Severe depletion  Thoracic and Lumbar Region Moderate depletion  Buccal Region Severe depletion  Temple Region Moderate depletion  Clavicle Bone Region Moderate depletion  Clavicle and Acromion Bone Region Severe depletion  Scapular Bone Region Severe depletion  Dorsal Hand Severe depletion  Patellar Region Severe depletion  Anterior Thigh Region Severe depletion  Posterior Calf Region Severe depletion  Edema (RD Assessment) None  Hair Other (Comment)  [hair loss]  Eyes Reviewed  Mouth Other (Comment)  [missing teeth]  Skin Reviewed  Nails Reviewed       Diet Order:   Diet Order             Diet Heart Room service appropriate? Yes; Fluid consistency: Thin; Fluid restriction: 1200 mL Fluid  Diet effective now                   EDUCATION NEEDS:   No education needs have been identified at this time  Skin:  Skin Assessment: Reviewed RN Assessment  Last BM:  8/27  Height:   Ht Readings from Last 1 Encounters:  05/20/21 5\' 5"  (1.651 m)    Weight:  Wt Readings from Last 10 Encounters:  05/20/21 71.7 kg  03/22/21 74.6 kg  03/01/21 78 kg  02/26/21 78 kg  02/25/21 77.9 kg  02/18/21 79 kg   BMI:  Body mass index is 26.29 kg/m.  Estimated Nutritional Needs:   Kcal:  1800-2000  Protein:  90-100 grams  Fluid:  >1.8L/d    Larkin Ina, MS, RD, LDN (she/her/hers) RD pager  number and weekend/on-call pager number located in [place].

## 2021-05-24 NOTE — Progress Notes (Signed)
PROGRESS NOTE    Stephanie Garza  GYJ:[id number] DOB: 04-26-1941 DOA: 05/20/2021 PCP: Zoila Shutter, NP   Chief Complaint  Patient presents with   Shortness of Breath    Brief Narrative:   Stephanie Garza is a 80 y.o. 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  Patient states she is chronically short of breath but last 2 days she has been having acutely worsening shortness of breath with cough productive of sputum denies any fever or chills.  Has chronic right-sided chest pain.  Patient was recently placed on oxycodone by patient's oncologist.  ER patient was short of breath had to be placed on BiPAP.  Chest x-ray does not show anything acute.  High sensitive troponins were negative.  BNP is 151.  EKG shows normal sinus rhythm.  COVID test was negative.  Labs show worsening of anemia with hemoglobin of 7.6 and macrocytic picture.  Stool for occult blood was negative.  Patient admitted for acute respiratory failure with hypoxia secondary to COPD.   Assessment & Plan:   Principal Problem:   Acute on chronic respiratory failure with hypoxia (HCC) Active Problems:   Chronic diastolic heart failure (HCC)   Chronic pain syndrome   Small cell carcinoma of lung, right (HCC)   COPD exacerbation (HCC)   Acute respiratory failure with hypoxia (HCC)   Acute on chronic respiratory failure with hypoxia  - on BiPAP secondary to COPD exacerbation.   -SHE Did not require BiPAP last 24 hours, it was discontinued from her room. -Continue to wean steroids, continue with azithromycin -He was encouraged to keep using incentive spirometer and flutter valve.   History of chronic diastolic CHF  - .Patient appears to be euvolemic currently, no obvious evidence of volume  overload  -Continue with home medications .  Small cell lung cancer  -She is being followed by Dr. Bobby Rumpf oncology at [facility] . intolerant to chemo being followed by oncologist.  Oncology notes in the chart mentions that for now they are holding off chemo.  Patient was recently placed on oxycodone and Ativan by oncologist.    Macrocytic anemia -B12 significantly low at 76, will replete. -she is Hemoccult negative . -Hemoglobin 7.1 on 8/27, she was transfused 1 unit PRBC, good response hemoglobin is 8.6 today.  B12 deficiency -on IM supplement, can be transitioned to oral on discharge.   Deconditioning /failure to thrive -we will consult PT/OT/nutritionist   Anxiety -Continue with home regimen   DVT prophylaxis: Lovenox Code Status: Full Disposition:  Family communication: Discussed with daughter by phone 8/27  Status is: Inpatient  Remains inpatient appropriate because:IV treatments appropriate due to intensity of illness or inability to take PO and Inpatient level of care appropriate due to severity of illness  Dispo: The patient is from: Home              Anticipated d/c is to: Home              Patient currently is not medically stable to d/c.   Difficult to place patient No       Consultants:  palliative   Subjective:  Reports good night sleep, reports dyspnea and cough has improved. Objective: Vitals:   05/24/21 0409 05/24/21 0721 05/24/21 0724 05/24/21 0800  BP: (!) 110/43   133/89  Pulse:    89  Resp:    15  Temp: 98.9 F (37.2 C)     TempSrc: Oral     SpO2:  97% 97% 94%  Weight:      Height:        Intake/Output Summary (Last 24 hours) at 05/24/2021 1347 Last data filed at 05/23/2021 1400 Gross per 24 hour  Intake 200 ml  Output 450 ml  Net -250 ml   Filed Weights   05/20/21 2242  Weight: 71.7 kg    Examination:  Awake Alert, Oriented X 3, frail, deconditioned, in no apparent distress Symmetrical Chest wall movement, Good air  movement bilaterally, no wheezing today RRR,No Gallops,Rubs or new Murmurs, No Parasternal Heave +ve B.Sounds, Abd Soft, No tenderness, No rebound - guarding or rigidity. No Cyanosis, Clubbing or edema, No new Rash or bruise       Data Reviewed: I have personally reviewed following labs and imaging studies  CBC: Recent Labs  Lab 05/20/21 2251 05/20/21 2328 05/21/21 0254 05/22/21 0234 05/22/21 2050 05/23/21 0336 05/24/21 0307  WBC 5.3  --  4.8 3.4*  --  6.8 5.8  NEUTROABS 3.8  --   --   --   --   --   --   HGB 7.6*   < > 7.7* 7.1* 9.3* 8.6* 8.4*  HCT 24.9*   < > 25.7* 22.3* 28.5* 26.5* 27.1*  MCV 102.0*  --  103.2* 97.4  --  95.3 96.4  PLT 179  --  144* 138*  --  127* 131*   < > = values in this interval not displayed.    Basic Metabolic Panel: Recent Labs  Lab 05/20/21 2251 05/20/21 2328 05/21/21 0254 05/22/21 0234 05/23/21 0336 05/24/21 0307  NA 139 139 138 137 135 138  K 4.5 4.2 4.5 4.8 4.5 3.9  CL 104  --  102 103 98 99  CO2 28  --  27 29 29  32  GLUCOSE 92  --  133* 119* 132* 87  BUN 21  --  17 21 22  25*  CREATININE 0.96  --  0.90 1.06* 1.01* 1.12*  CALCIUM 8.6*  --  8.8* 8.9 8.9 8.5*    GFR: Estimated Creatinine Clearance: 40.4 mL/min (A) (by C-G formula based on SCr of 1.12 mg/dL (H)).  Liver Function Tests: No results for input(s): AST, ALT, ALKPHOS, BILITOT, PROT, ALBUMIN in the last 168 hours.  CBG: No results for input(s): GLUCAP in the last 168 hours.   Recent Results (from the past 240 hour(s))  Resp Panel by RT-PCR (Flu A&B, Covid) Nasopharyngeal Swab     Status: None   Collection Time: 05/20/21 10:51 PM   Specimen: Nasopharyngeal Swab; Nasopharyngeal(NP) swabs in vial transport medium  Result Value Ref Range Status   SARS Coronavirus 2 by RT PCR NEGATIVE NEGATIVE Final    Comment: (NOTE) SARS-CoV-2 target nucleic acids are NOT DETECTED.  The SARS-CoV-2 RNA is generally detectable in upper respiratory specimens during the acute phase of  infection. The lowest concentration of SARS-CoV-2 viral copies this assay can detect is 138 copies/mL. A negative result does not preclude SARS-Cov-2 infection and should not be used as the sole basis for treatment or other patient management decisions. A negative result may occur with  improper specimen collection/handling, submission of specimen other than nasopharyngeal swab, presence of viral mutation(s) within the areas targeted by this assay, and inadequate number of viral copies(<138 copies/mL). A negative  result must be combined with clinical observations, patient history, and epidemiological information. The expected result is Negative.  Fact Sheet for Patients:  EntrepreneurPulse.com.au  Fact Sheet for Healthcare Providers:  IncredibleEmployment.be  This test is no t yet approved or cleared by the [place] FDA and  has been authorized for detection and/or diagnosis of SARS-CoV-2 by FDA under an Emergency Use Authorization (EUA). This EUA will remain  in effect (meaning this test can be used) for the duration of the COVID-19 declaration under Section 564(b)(1) of the Act, 21 U.S.C.section 360bbb-3(b)(1), unless the authorization is terminated  or revoked sooner.       Influenza A by PCR NEGATIVE NEGATIVE Final   Influenza B by PCR NEGATIVE NEGATIVE Final    Comment: (NOTE) The Xpert Xpress SARS-CoV-2/FLU/RSV plus assay is intended as an aid in the diagnosis of influenza from Nasopharyngeal swab specimens and should not be used as a sole basis for treatment. Nasal washings and aspirates are unacceptable for Xpert Xpress SARS-CoV-2/FLU/RSV testing.  Fact Sheet for Patients: EntrepreneurPulse.com.au  Fact Sheet for Healthcare Providers: IncredibleEmployment.be  This test is not yet approved or cleared by the [place] FDA and has been authorized for detection and/or diagnosis of SARS-CoV-2  by FDA under an Emergency Use Authorization (EUA). This EUA will remain in effect (meaning this test can be used) for the duration of the COVID-19 declaration under Section 564(b)(1) of the Act, 21 U.S.C. section 360bbb-3(b)(1), unless the authorization is terminated or revoked.  Performed at [facility], [place] [address] [place], [place] [address]          Radiology Studies: No results found.      Scheduled Meds:  aspirin EC  81 mg Oral Daily   azithromycin  500 mg Oral Daily   budesonide (PULMICORT) nebulizer solution  0.25 mg Nebulization BID   buPROPion  300 mg Oral Daily   cyanocobalamin  1,000 mcg Intramuscular Daily   Followed by   Derrill Memo ON 05/29/2021] cyanocobalamin  1,000 mcg Intramuscular Weekly   feeding supplement  237 mL Oral BID BM   furosemide  20 mg Oral Daily   gabapentin  800 mg Oral TID   ipratropium-albuterol  3 mL Nebulization TID   LORazepam  1.5 mg Oral Q8H   methylPREDNISolone (SOLU-MEDROL) injection  40 mg Intravenous Q12H   oxyCODONE  30 mg Oral Q12H   pantoprazole  40 mg Oral Daily   primidone  50 mg Oral QHS   sertraline  25 mg Oral Daily   spironolactone  25 mg Oral Daily   Continuous Infusions:   LOS: 3 days     Phillips Climes, MD [facility]   To contact the attending provider between 7A-7P or the covering provider during after hours 7P-7A, please log into the web site [email or web address] and access using universal [place] password for that web site. If you do not have the password, please call the hospital operator.  05/24/2021, 1:47 PM

## 2021-05-24 NOTE — Progress Notes (Signed)
Daily Progress Note   Patient Name: Stephanie Garza       Date: 05/24/2021 DOB: July 27, 1941  Age: 80 y.o. MRN#: [id number] Attending Physician: Albertine Patricia, MD Primary Care Physician: Zoila Shutter, NP Admit Date: 05/20/2021  Reason for Consultation/Follow-up: goals of care, symptom management  Subjective: Chart reviewed. Discussed with Dr. Waldron Labs. Per his discussion with radiation oncology, it is unlikely patient will be receiving any additional treatment. She completed 17 out of 29 sessions, but this took several months and she did miss multiple appointments due to transportation issues.  I spoke with Webb Silversmith, liaison with [facility] outpatient palliative (through [place]). I confirmed with her that [place] would be able to take over ongoing management of her pain medication after discharge from the hospital. Cheri estimates they can have someone out to see her within a week of discharge. I will plan to prescribe enough medication for 10 days.   I went to see patient at bedside. She states her pain is somewhat improved. Per MAR review, she has only required 1 dose of PRN oxycodone in the last 24 hours. I let her know that outpatient palliative would be taking over management of her pain medication after discharge.   Discussed with patient her treatment course with radiation oncology. She is aware that she will not be receiving any additional treatment from her previous providers. She shares that her children are pushing her to pursue additional treatment. She is hoping to become a patient with radiation oncology in [facility] but has not yet taken any steps to start this process.  Brief discussion was had regarding the  possibility that she will not be able to pursue additional treatment even with another health care system. Education provided that she would be eligible for hospice care at this point. Patient verbalizes understanding and is receptive of this information.   Length of Stay: 3   Physical Exam Vitals reviewed.  Constitutional:      General: She is not in acute distress.    Appearance: She is ill-appearing.  Pulmonary:     Effort: Pulmonary effort is normal.     Comments: Congested cough Neurological:     Mental Status: She is alert and oriented to person, place, and time.  Vital Signs: BP 133/89 (BP Location: Right Arm)   Pulse 89   Temp 98.9 F (37.2 C) (Oral)   Resp 15   Ht 5\' 5"  (1.651 m)   Wt 71.7 kg   SpO2 98%   BMI 26.29 kg/m  SpO2: SpO2: 98 % O2 Device: O2 Device: Nasal Cannula O2 Flow Rate: O2 Flow Rate (L/min): 4 L/min  Intake/output summary: No intake or output data in the 24 hours ending 05/24/21 1605 LBM: Last BM Date: 05/22/21 Baseline Weight: Weight: 71.7 kg Most recent weight: Weight: 71.7 kg       Palliative Assessment/Data: PPS 50%    Palliative Care Assessment & Plan   Patient Profile: 80 y.o. female  with past medical history of COPD on home oxygen 3 L, chronic diastolic CHF, and recent diagnosis of small cell lung cancer presented to the ED on 05/20/21 from home with complaints of worsening shortness of breath x2 days with productive cough. Patient was admitted on 05/20/2021 with acute on chronic respiratory failure with hypoxia secondary to COPD exacerbation, small cell lung cancer.    ED Course: In the ER patient was short of breath had to be placed on BiPAP.  Chest x-ray does not show anything acute.  High sensitive troponins were negative.  BNP is 151.  EKG shows normal sinus rhythm.  COVID test was negative.  Labs show worsening of anemia with hemoglobin of 7.6 and macrocytic picture.  Stool for occult blood was negative.  Patient admitted  for acute respiratory failure with hypoxia secondary to COPD.   Assessment: Acute on chronic respiratory failure with hypoxia Chronic diastolic heart failure Chronic pain syndrome Small cell lung carcinoma COPD exacerbation  Recommendations/Plan: Continue oxycontin 30 mg every 12 hours  Continue oxycodone IR 10 mg every 6 hours as needed for breakthrough pain  Will need Rx at discharge for 10 day supply (This NP will provide Rx if needed) Stop taking oxycodone IR 15 mg at home Outpatient Palliative ([facility]) will provide ongoing management of patient's pain medications after discharge  Goals of Care and Additional Recommendations: Limitations on Scope of Treatment: Full Scope Treatment  Code Status: Full  Prognosis:  Unable to determine  Discharge Planning: Home with [facility] and outpatient palliative  Care plan was discussed with Dr. Waldron Labs  Thank you for allowing the Palliative Medicine Team to assist in the care of this patient.   Total Time 35 minutes Prolonged Time Billed  no       Greater than 50%  of this time was spent counseling and coordinating care related to the above assessment and plan.  Lavena Bullion, NP  Please contact Palliative Medicine Team phone at [phone] for questions and concerns.

## 2021-05-24 NOTE — Progress Notes (Signed)
Pt resting comfortably with stable VS. No bipap needed at this time.

## 2021-05-24 NOTE — TOC Progression Note (Addendum)
Transition of Care [facility]) - Progression Note    Patient Details  Name: Stephanie Garza MRN: [id number] Date of Birth: [birth year]  Transition of Care [facility]) CM/SW Contact  Joanne Chars, LCSW Phone Number: 05/24/2021, 10:59 AM  Clinical Narrative:  CSW spoke with pt regarding HH recommendation.  Pt now saying she will accept HH, choice document given, no preference indicated.  Choice also given regarding outpt palliative care and pt agreeable to [facility].  Pt said her home O2 supplier is on [address] in [place] and reports she is not happy with them because they have to drive there to get tanks refilled.  She asked CSW to talk to her daughter about plan moving forward.  Pt has 3n1 at home but would like rollator.  PCP in place.  Permission to speak with daughter Kyra Searles, who is POA and son Simona Huh. Pt is not vaccinated for covid.  CSW attempted to call daughter, Kyra Searles.  Waiting on return call. [facility] patient is located on [facility], will attempt to confirm with daughter.  CSW spoke with [place] and made referral for outpt palliative.    1330: Spoke with daughter Kyra Searles, who reports she is pt POA.  Faith reports she is a current pt with [employer] for home O2, they have had problems with [facility] patient and she would like her mother to switch to [facility].  Also discussed HH and she would like [facility].    1345: CSW spoke with [name] at [facility], whether they can take over this pt depends on some details of how long pt has been with [facility] pt.  CSW able to speak to [facility] pt who reports pt active x4 months.  [name] reviewing for [facility].  Message left with [facility].      Expected Discharge Plan: Home/Self Care Barriers to Discharge: Continued Medical Work up  Expected Discharge Plan and Services Expected Discharge Plan: Home/Self Care                                               Social Determinants of Health  (SDOH) Interventions    Readmission Risk Interventions No flowsheet data found.

## 2021-05-24 NOTE — Progress Notes (Signed)
Physical Therapy Treatment Patient Details Name: Stephanie Garza MRN: [id number] DOB: 1941-03-22 Today's Date: 05/24/2021    History of Present Illness 79 y.o. female presents to [facility] hospital on 05/21/2021 with worsening SOB. Pt admitted for acute on chronic respiratory failure with hypoxia. PMH includes COPD, CHF, small cell lung cancer.    PT Comments    Pt tolerated treatment well with sats >90% on 4L of O2. Pt increased ambulation distance compared to previous sessions and performed 5 sit>stands in 30s. Continue to recommend HHPT, as pt demonstrates decreased muscular endurance, balance, and posture when ambulating. Although pt was considering outpatient PT, is agreeable to [facility] recommendation due to current performance in PT.   Follow Up Recommendations  Home health PT;Supervision - Intermittent     Equipment Recommendations  None recommended by PT    Recommendations for Other Services       Precautions / Restrictions Precautions Precautions: Fall Restrictions Weight Bearing Restrictions: No    Mobility  Bed Mobility               General bed mobility comments: In recliner upon arrival    Transfers Overall transfer level: Needs assistance Equipment used: Rolling walker (2 wheeled);None Transfers: Sit to/from Stand Sit to Stand: Supervision         General transfer comment: Sit to stand x6 from recliner with supervision and RW. Verbal cues for hand placement. Performed 5 sit to stands in 30s with increased time. Verbal cues for upright posture prior to performing 30s sit to stand. Increased time with decreased eccentric control when lowering.  Ambulation/Gait Ambulation/Gait assistance: Min guard Gait Distance (Feet): 136 Feet Assistive device: Rolling walker (2 wheeled) Gait Pattern/deviations: Step-through pattern;Trunk flexed Gait velocity: Decreased Gait velocity interpretation: <1.8 ft/sec, indicate of risk for recurrent falls General Gait Details:  Demonstrated increased trunk flexion and pushing RW far from body. Verbal cues to correct posture and keep RW close. Pt had difficulty navigating obstacles while ambulating.   Stairs             Wheelchair Mobility    Modified Rankin (Stroke Patients Only)       Balance           Standing balance support: Bilateral upper extremity supported;During functional activity Standing balance-Leahy Scale: Poor Standing balance comment: Requires BUE support                            Cognition Arousal/Alertness: Awake/alert Behavior During Therapy: WFL for tasks assessed/performed Overall Cognitive Status: Within Functional Limits for tasks assessed                                        Exercises General Exercises - Lower Extremity Long Arc Quad: AROM;Both;10 reps;Seated    General Comments General comments (skin integrity, edema, etc.): O2 > 90% on 4L. Pt wheezing at end of session with sats still > 90%.      Pertinent Vitals/Pain Pain Assessment: No/denies pain    Home Living                      Prior Function            PT Goals (current goals can now be found in the care plan section) Progress towards PT goals: Progressing toward goals    Frequency    Min  3X/week      PT Plan      Co-evaluation              AM-PAC PT "6 Clicks" Mobility   Outcome Measure  Help needed turning from your back to your side while in a flat bed without using bedrails?: A Little Help needed moving from lying on your back to sitting on the side of a flat bed without using bedrails?: A Little Help needed moving to and from a bed to a chair (including a wheelchair)?: A Little Help needed standing up from a chair using your arms (e.g., wheelchair or bedside chair)?: A Little Help needed to walk in hospital room?: A Little Help needed climbing 3-5 steps with a railing? : A Lot 6 Click Score: 17    End of Session Equipment  Utilized During Treatment: Oxygen Activity Tolerance: Patient tolerated treatment well Patient left: in chair;with call bell/phone within reach;with chair alarm set Nurse Communication: Mobility status PT Visit Diagnosis: Other abnormalities of gait and mobility (R26.89)     Time: 8338-2505 PT Time Calculation (min) (ACUTE ONLY): 18 min  Charges:  $Gait Training: 8-22 mins                     Louie Casa, SPT Acute Rehab: [phone]    Domingo Dimes 05/24/2021, 11:00 AM

## 2021-05-24 NOTE — Progress Notes (Signed)
Pt has PRN Bipap orders.  Pt in no distress at this time.

## 2021-05-25 DIAGNOSIS — I5032 Chronic diastolic (congestive) heart failure: Secondary | ICD-10-CM | POA: Diagnosis not present

## 2021-05-25 DIAGNOSIS — G894 Chronic pain syndrome: Secondary | ICD-10-CM | POA: Diagnosis not present

## 2021-05-25 DIAGNOSIS — J9621 Acute and chronic respiratory failure with hypoxia: Secondary | ICD-10-CM | POA: Diagnosis not present

## 2021-05-25 DIAGNOSIS — J441 Chronic obstructive pulmonary disease with (acute) exacerbation: Secondary | ICD-10-CM | POA: Diagnosis not present

## 2021-05-25 DIAGNOSIS — E43 Unspecified severe protein-calorie malnutrition: Secondary | ICD-10-CM | POA: Insufficient documentation

## 2021-05-25 LAB — BASIC METABOLIC PANEL
Anion gap: 10 (ref 5–15)
BUN: 25 mg/dL — ABNORMAL HIGH (ref 8–23)
CO2: 31 mmol/L (ref 22–32)
Calcium: 9 mg/dL (ref 8.9–10.3)
Chloride: 97 mmol/L — ABNORMAL LOW (ref 98–111)
Creatinine, Ser: 0.9 mg/dL (ref 0.44–1.00)
GFR, Estimated: >60 mL/min (ref >60)
Glucose, Bld: 155 mg/dL — ABNORMAL HIGH (ref 70–99)
Potassium: 4.3 mmol/L (ref 3.5–5.1)
Sodium: 138 mmol/L (ref 135–145)

## 2021-05-25 LAB — CBC
HCT: 28.3 % — ABNORMAL LOW (ref 36.0–46.0)
Hemoglobin: 9 g/dL — ABNORMAL LOW (ref 12.0–15.0)
MCH: 30.3 pg (ref 26.0–34.0)
MCHC: 31.8 g/dL (ref 30.0–36.0)
MCV: 95.3 fL (ref 80.0–100.0)
Platelets: 138 10*3/uL — ABNORMAL LOW (ref 150–400)
RBC: 2.97 MIL/uL — ABNORMAL LOW (ref 3.87–5.11)
RDW: 15.6 % — ABNORMAL HIGH (ref 11.5–15.5)
WBC: 4.5 10*3/uL (ref 4.0–10.5)
nRBC: 0 % (ref 0.0–0.2)

## 2021-05-25 MED ORDER — XTAMPZA ER 27 MG PO C12A: 27.0000 mg | EXTENDED_RELEASE_CAPSULE | Freq: Two times a day (BID) | ORAL | 0 refills | Status: AC

## 2021-05-25 MED ORDER — OXYCODONE HCL ER 30 MG PO T12A: 30.0000 mg | EXTENDED_RELEASE_TABLET | Freq: Two times a day (BID) | ORAL | 0 refills | Status: DC

## 2021-05-25 MED ORDER — OXYCODONE HCL 10 MG PO TABS: 10.0000 mg | ORAL_TABLET | Freq: Four times a day (QID) | ORAL | 0 refills | Status: AC | PRN

## 2021-05-25 MED ORDER — PREDNISONE 10 MG (21) PO TBPK: ORAL_TABLET | ORAL | 0 refills | Status: AC

## 2021-05-25 MED ORDER — ACETAMINOPHEN 325 MG PO TABS: 650.0000 mg | ORAL_TABLET | Freq: Four times a day (QID) | ORAL | Status: AC | PRN

## 2021-05-25 MED ORDER — LORAZEPAM 1 MG PO TABS: 1.5000 mg | ORAL_TABLET | Freq: Three times a day (TID) | ORAL | 0 refills | Status: AC | PRN

## 2021-05-25 MED ORDER — ADULT MULTIVITAMIN W/MINERALS CH: 1.0000 | ORAL_TABLET | Freq: Every day | ORAL | 0 refills | Status: AC

## 2021-05-25 MED ORDER — IPRATROPIUM-ALBUTEROL 0.5-2.5 (3) MG/3ML IN SOLN: 3.0000 mL | Freq: Two times a day (BID) | RESPIRATORY_TRACT | Status: DC

## 2021-05-25 MED ORDER — CYANOCOBALAMIN 500 MCG PO TABS: 500.0000 ug | ORAL_TABLET | Freq: Every day | ORAL | 1 refills | Status: AC

## 2021-05-25 MED ORDER — ENSURE ENLIVE PO LIQD: 237.0000 mL | Freq: Three times a day (TID) | ORAL | 12 refills | Status: AC

## 2021-05-25 NOTE — TOC Transition Note (Signed)
Transition of Care [facility]) - CM/SW Discharge Note   Patient Details  Name: Stephanie Garza MRN: [id number] Date of Birth: May 31, 1941  Transition of Care [facility]) CM/SW Contact:  Joanne Chars, LCSW Phone Number: 05/25/2021, 1:48 PM   Clinical Narrative:  Pt discharging home with [facility]  Home O2 provided by [facility] Patient.  Daughter Kyra Searles will transport pt home.       Final next level of care: [place] Barriers to Discharge: Barriers Resolved   Patient Goals and CMS Choice Patient states their goals for this hospitalization and ongoing recovery are:: to go home      Discharge Placement                  Name of family member notified: daughter Faith Patient and family notified of of transfer: 05/25/21  Discharge Plan and Services                DME Arranged: Oxygen DME Agency: Other - Comment ([place] Patient) Date DME Agency Contacted: 05/25/21 Time DME Agency Contacted: 56 Representative spoke with at DME Agency: Beth HH Arranged: PT, OT, Nurse's Aide [place] Agency: [place] Date [place]: 05/25/21 Time [place]: 63 Representative spoke with at [place]: [place] ([place]) Interventions     Readmission Risk Interventions Readmission Risk Prevention Plan 05/25/2021  Transportation Screening Complete  PCP or Specialist Appt within 3-5 Days Complete  HRI or [place] Complete  Social Work Consult for [place] Planning/Counseling Complete  Palliative Care Screening Not Applicable  Some recent data might be hidden

## 2021-05-25 NOTE — Progress Notes (Addendum)
Physician Discharge Summary  Stephanie Garza ZHY:[id number] DOB: 11-Oct-1940 DOA: 05/20/2021  PCP: Zoila Shutter, NP  Admit date: 05/20/2021 Discharge date: 05/25/2021  Admitted From: Home Disposition:  Home   Recommendations for Outpatient Follow-up:  Follow up with PCP in 1 weeks Please obtain BMP/CBC in one week Patient to continue with B12 supplement, recheck level in 6 to 8 weeks, level significantly low at 76 Encourage patient to increase her nutritional supplements as well.   Home Health:YES Equipment/Devices: She already has home oxygen, rolling walker  Discharge Condition:Stable CODE STATUS:FUL Diet recommendation: Heart Healthy  Brief/Interim Summary:    Acute on chronic respiratory failure with hypoxia  -Required BiPAP initially due to COPD exacerbation and increased work of breathing, she was treated with IV steroids, and azithromycin as well, respiratory status gradually improved, she weaned off BiPAP totally, no requirement of BiPAP over last 2 days, dyspnea has significantly improved, no wheezing today, she is back on her baseline 3 L oxygen via nasal cannula, she will be discharged on prednisone taper . -He was encouraged to keep using incentive spirometer and flutter valve.     History of chronic diastolic CHF  - .Patient appears to be euvolemic currently, no obvious evidence of volume overload  -Continue with home medications .   Small cell lung cancer  -She is being followed by Dr. Bobby Rumpf oncology at [facility] .  As well she was receiving radiation oncology, given her very poor functional status apparently chemo and radiation has been held. -I have discussed with radiation oncology at [facility] upon patient/family request to reassess if patient can do on her deviation therapy sessions, she did finish 17 out of 29 treatment sessions, but she had frequent interruptions of her therapy for which patient therapy has been stopped, and our local  radiation oncologist would initiate/resume any patient's therapy here, and they recommend she follows with her primary radiation oncologist as well she is having referral to follow with [facility] in [facility] as well.  Chronic pain syndrome -Palliative medicine input greatly appreciated, her medicine has been adjusted, and she is connected to palliative on discharge.(She is not hospice.)  Macrocytic anemia -B12 significantly low at 76, will replete. -she is Hemoccult negative . -Hemoglobin 7.1 on 8/27, she was transfused 1 unit PRBC, good response hemoglobin is 8.6 today.   B12 deficiency -She received IM supplements during hospital stay, she will be discharged on oral supplement, monitor closely as an outpatient     Deconditioning /failure to thrive -PT/OT arranged as an outpatient.   Anxiety -Continue with home regimen  Severe protein calorie malnutrition -Nutritionist were consulted  Discharge Diagnoses:  Principal Problem:   Acute on chronic respiratory failure with hypoxia (HCC) Active Problems:   Chronic diastolic heart failure (HCC)   Chronic pain syndrome   Small cell carcinoma of lung, right (HCC)   COPD exacerbation (HCC)   Acute respiratory failure with hypoxia (HCC)   Protein-calorie malnutrition, severe    Discharge Instructions  Discharge Instructions     Diet - low sodium heart healthy   Complete by: As directed    Discharge instructions   Complete by: As directed    Follow with Primary MD Zoila Shutter, NP in 7 days   Get CBC, CMP, y checked  by Primary MD next visit.    Activity: As tolerated with Full fall precautions use walker/cane & assistance as needed   Disposition Home    Diet: Heart Healthy  , with  feeding assistance and aspiration precautions.  For Heart failure patients - Check your Weight same time everyday, if you gain over 2 pounds, or you develop in leg swelling, experience more shortness of breath or chest pain, call your  Primary MD immediately. Follow Cardiac Low Salt Diet and 1.5 lit/day fluid restriction.   On your next visit with your primary care physician please Get Medicines reviewed and adjusted.   Please request your Prim.MD to go over all Hospital Tests and Procedure/Radiological results at the follow up, please get all Hospital records sent to your Prim MD by signing hospital release before you go home.   If you experience worsening of your admission symptoms, develop shortness of breath, life threatening emergency, suicidal or homicidal thoughts you must seek medical attention immediately by calling 911 or calling your MD immediately  if symptoms less severe.  You Must read complete instructions/literature along with all the possible adverse reactions/side effects for all the Medicines you take and that have been prescribed to you. Take any new Medicines after you have completely understood and accpet all the possible adverse reactions/side effects.   Do not drive, operating heavy machinery, perform activities at heights, swimming or participation in water activities or provide baby sitting services if your were admitted for syncope or siezures until you have seen by Primary MD or a Neurologist and advised to do so again.  Do not drive when taking Pain medications.    Do not take more than prescribed Pain, Sleep and Anxiety Medications  Special Instructions: If you have smoked or chewed Tobacco  in the last 2 yrs please stop smoking, stop any regular Alcohol  and or any Recreational drug use.  Wear Seat belts while driving.   Please note  You were cared for by a hospitalist during your hospital stay. If you have any questions about your discharge medications or the care you received while you were in the hospital after you are discharged, you can call the unit and asked to speak with the hospitalist on call if the hospitalist that took care of you is not available. Once you are discharged, your  primary care physician will handle any further medical issues. Please note that NO REFILLS for any discharge medications will be authorized once you are discharged, as it is imperative that you return to your primary care physician (or establish a relationship with a primary care physician if you do not have one) for your aftercare needs so that they can reassess your need for medications and monitor your lab values.   Increase activity slowly   Complete by: As directed       Allergies as of 05/25/2021       Reactions   Cefaclor Rash        Medication List     STOP taking these medications    nitrofurantoin (macrocrystal-monohydrate) 100 MG capsule Commonly known as: Macrobid   oxyCODONE 15 MG immediate release tablet Commonly known as: ROXICODONE Replaced by: oxyCODONE 30 MG 12 hr tablet       TAKE these medications    acetaminophen 325 MG tablet Commonly known as: TYLENOL Take 2 tablets (650 mg total) by mouth every 6 (six) hours as needed for mild pain (or Fever >/= 101).   albuterol 108 (90 Base) MCG/ACT inhaler Commonly known as: VENTOLIN HFA Inhale 2 puffs into the lungs every 4 (four) hours as needed for wheezing or shortness of breath.   aspirin 81 MG EC tablet Take 81 mg  by mouth daily.   budesonide-formoterol 160-4.5 MCG/ACT inhaler Commonly known as: SYMBICORT Inhale 2 puffs into the lungs 2 (two) times daily.   buPROPion 300 MG 24 hr tablet Commonly known as: WELLBUTRIN XL Take 300 mg by mouth daily.   dicyclomine 20 MG tablet Commonly known as: BENTYL Take 20 mg by mouth 2 (two) times daily as needed for spasms.   diphenoxylate-atropine 2.5-0.025 MG tablet Commonly known as: LOMOTIL Take 2 tablets by mouth 4 (four) times daily as needed for diarrhea or loose stools (1-2 TABLETS AFTER EACH LOOSE STOOL - MAXIMUM OF 10 DAILY).   famotidine 20 MG tablet Commonly known as: PEPCID Take 20 mg by mouth daily.   feeding supplement Liqd Take 237 mLs by  mouth 3 (three) times daily between meals.   fluticasone 50 MCG/ACT nasal spray Commonly known as: FLONASE Place 2 sprays into both nostrils daily as needed for allergies.   furosemide 20 MG tablet Commonly known as: LASIX Take 20 mg by mouth daily.   gabapentin 800 MG tablet Commonly known as: NEURONTIN Take 800 mg by mouth 3 (three) times daily.   ipratropium-albuterol 0.5-2.5 (3) MG/3ML Soln Commonly known as: DUONEB Take 3 mLs by nebulization every 4 (four) hours as needed (shortness of breath).   LORazepam 1 MG tablet Commonly known as: ATIVAN Take 1.5 tablets (1.5 mg total) by mouth every 8 (eight) hours. What changed:  when to take this reasons to take this   multivitamin with minerals Tabs tablet Take 1 tablet by mouth daily. Start taking on: May 26, 2021   omeprazole 40 MG capsule Commonly known as: PRILOSEC Take 40 mg by mouth daily.   ondansetron 8 MG tablet Commonly known as: ZOFRAN Take 8 mg by mouth every 8 (eight) hours as needed for nausea.   oxyCODONE 30 MG 12 hr tablet Take 1 tablet (30 mg total) by mouth every 12 (twelve) hours. Replaces: oxyCODONE 15 MG immediate release tablet   Oxycodone HCl 10 MG Tabs Take 1 tablet (10 mg total) by mouth every 6 (six) hours as needed for breakthrough pain.   OXYGEN Inhale 3 L into the lungs continuous.   potassium chloride SA 20 MEQ tablet Commonly known as: KLOR-CON Take 20 mEq by mouth daily.   predniSONE 10 MG (21) Tbpk tablet Commonly known as: STERAPRED UNI-PAK 21 TAB Use per package instruction   primidone 50 MG tablet Commonly known as: MYSOLINE Take 50 mg by mouth at bedtime.   prochlorperazine 10 MG tablet Commonly known as: COMPAZINE Take 1 tablet (10 mg total) by mouth every 6 (six) hours as needed for nausea or vomiting.   sertraline 25 MG tablet Commonly known as: ZOLOFT Take 25 mg by mouth daily.   Spiriva HandiHaler 18 MCG inhalation capsule Generic drug: tiotropium Place 18  mcg into inhaler and inhale daily.   spironolactone 25 MG tablet Commonly known as: ALDACTONE Take 25 mg by mouth daily.   tamsulosin 0.4 MG Caps capsule Commonly known as: FLOMAX Take 0.4 mg by mouth daily.   Trelegy Ellipta 100-62.5-25 MCG/INH Aepb Generic drug: Fluticasone-Umeclidin-Vilant Inhale 1 puff into the lungs daily.   vitamin B-12 500 MCG tablet Commonly known as: CYANOCOBALAMIN Take 1 tablet (500 mcg total) by mouth daily.   Vitamin D (Ergocalciferol) 1.25 MG (50000 UNIT) Caps capsule Commonly known as: DRISDOL Take 50,000 Units by mouth every 7 (seven) days.               Durable Medical Equipment  (From  admission, onward)           Start     Ordered   05/25/21 1336  For home use only DME Walker rolling  Once       Question Answer Comment  Walker: With [place]   Patient needs a walker to treat with the following condition Weakness      05/25/21 1335            Follow-up Information     Zoila Shutter, NP Follow up.   Contact information: [place] [address] [phone]         [facility], [facility] Follow up.   Specialty: Home Health Services Why: Alvis Lemmings will contact you to schedule your first home visit. Contact information: [place] [address] [phone]         [facility] (outpatient palliative care) Follow up.   Why: [facility] will contact you to schedule your first home visit Contact information: [phone]               Allergies  Allergen Reactions   Cefaclor Rash    Consultations: Palliative medicine Discussed with radiation oncology by phone  Procedures/Studies: DG Chest Port 1 View  Result Date: 05/20/2021 CLINICAL DATA:  Shortness of breath EXAM: PORTABLE CHEST 1 VIEW COMPARISON:  05/11/2021 FINDINGS: Right Port-A-Cath remains in place, unchanged. Scarring at the left base. Right lung clear. Heart is normal size. No  effusions or acute bony abnormality. IMPRESSION: Left basilar scarring. No active disease. Electronically Signed   By: Rolm Baptise M.D.   On: 05/20/2021 22:53      Subjective:  Reports she is feeling better today, dyspnea has improved, report her appetite has improved as well, she is eager to go home today. Discharge Exam: Vitals:   05/25/21 0411 05/25/21 0800  BP: (!) 121/57 (!) 134/100  Pulse:  68  Resp:  14  Temp: 97.8 F (36.6 C)   SpO2:  100%   Vitals:   05/24/21 2200 05/24/21 2357 05/25/21 0411 05/25/21 0800  BP:   (!) 121/57 (!) 134/100  Pulse: 76   68  Resp: (!) 22   14  Temp:  97.6 F (36.4 C) 97.8 F (36.6 C)   TempSrc:  Oral Oral   SpO2: 98%   100%  Weight:      Height:        General: Pt is alert, awake, not in acute distress, overall frail, but she appears to be in good spirits today, comfortable, no apparent distress. Cardiovascular: RRR, S1/S2 +, no rubs, no gallops Respiratory: CTA bilaterally, no wheezing, no rhonchi Abdominal: Soft, NT, ND, bowel sounds + Extremities: no edema, no cyanosis    The results of significant diagnostics from this hospitalization (including imaging, microbiology, ancillary and laboratory) are listed below for reference.     Microbiology: Recent Results (from the past 240 hour(s))  Resp Panel by RT-PCR (Flu A&B, Covid) Nasopharyngeal Swab     Status: None   Collection Time: 05/20/21 10:51 PM   Specimen: Nasopharyngeal Swab; Nasopharyngeal(NP) swabs in vial transport medium  Result Value Ref Range Status   SARS Coronavirus 2 by RT PCR NEGATIVE NEGATIVE Final    Comment: (NOTE) SARS-CoV-2 target nucleic acids are NOT DETECTED.  The SARS-CoV-2 RNA is generally detectable in upper respiratory specimens during the acute phase of infection. The lowest concentration of SARS-CoV-2 viral copies this assay can detect is 138 copies/mL. A negative  result does not preclude SARS-Cov-2 infection and should not be used as the sole  basis for treatment or other patient management decisions. A negative result may occur with  improper specimen collection/handling, submission of specimen other than nasopharyngeal swab, presence of viral mutation(s) within the areas targeted by this assay, and inadequate number of viral copies(<138 copies/mL). A negative result must be combined with clinical observations, patient history, and epidemiological information. The expected result is Negative.  Fact Sheet for Patients:  EntrepreneurPulse.com.au  Fact Sheet for Healthcare Providers:  IncredibleEmployment.be  This test is no t yet approved or cleared by the [place] FDA and  has been authorized for detection and/or diagnosis of SARS-CoV-2 by FDA under an Emergency Use Authorization (EUA). This EUA will remain  in effect (meaning this test can be used) for the duration of the COVID-19 declaration under Section 564(b)(1) of the Act, 21 U.S.C.section 360bbb-3(b)(1), unless the authorization is terminated  or revoked sooner.       Influenza A by PCR NEGATIVE NEGATIVE Final   Influenza B by PCR NEGATIVE NEGATIVE Final    Comment: (NOTE) The Xpert Xpress SARS-CoV-2/FLU/RSV plus assay is intended as an aid in the diagnosis of influenza from Nasopharyngeal swab specimens and should not be used as a sole basis for treatment. Nasal washings and aspirates are unacceptable for Xpert Xpress SARS-CoV-2/FLU/RSV testing.  Fact Sheet for Patients: EntrepreneurPulse.com.au  Fact Sheet for Healthcare Providers: IncredibleEmployment.be  This test is not yet approved or cleared by the [place] FDA and has been authorized for detection and/or diagnosis of SARS-CoV-2 by FDA under an Emergency Use Authorization (EUA). This EUA will remain in effect (meaning this test can be used) for the duration of the COVID-19 declaration under Section 564(b)(1) of the Act,  21 U.S.C. section 360bbb-3(b)(1), unless the authorization is terminated or revoked.  Performed at [facility], [place] [address] [place], [place] [address]      Labs: BNP (last 3 results) Recent Labs    05/20/21 2251  BNP 160.7*   Basic Metabolic Panel: Recent Labs  Lab 05/21/21 0254 05/22/21 0234 05/23/21 0336 05/24/21 0307 05/25/21 0225  NA 138 137 135 138 138  K 4.5 4.8 4.5 3.9 4.3  CL 102 103 98 99 97*  CO2 27 29 29  32 31  GLUCOSE 133* 119* 132* 87 155*  BUN 17 21 22  25* 25*  CREATININE 0.90 1.06* 1.01* 1.12* 0.90  CALCIUM 8.8* 8.9 8.9 8.5* 9.0   Liver Function Tests: No results for input(s): AST, ALT, ALKPHOS, BILITOT, PROT, ALBUMIN in the last 168 hours. No results for input(s): LIPASE, AMYLASE in the last 168 hours. No results for input(s): AMMONIA in the last 168 hours. CBC: Recent Labs  Lab 05/20/21 2251 05/20/21 2328 05/21/21 0254 05/22/21 0234 05/22/21 2050 05/23/21 0336 05/24/21 0307 05/25/21 0225  WBC 5.3  --  4.8 3.4*  --  6.8 5.8 4.5  NEUTROABS 3.8  --   --   --   --   --   --   --   HGB 7.6*   < > 7.7* 7.1* 9.3* 8.6* 8.4* 9.0*  HCT 24.9*   < > 25.7* 22.3* 28.5* 26.5* 27.1* 28.3*  MCV 102.0*  --  103.2* 97.4  --  95.3 96.4 95.3  PLT 179  --  144* 138*  --  127* 131* 138*   < > = values in this interval not displayed.   Cardiac Enzymes: No results for input(s): CKTOTAL, CKMB, CKMBINDEX,  TROPONINI in the last 168 hours. BNP: Invalid input(s): POCBNP CBG: No results for input(s): GLUCAP in the last 168 hours. D-Dimer No results for input(s): DDIMER in the last 72 hours. Hgb A1c No results for input(s): HGBA1C in the last 72 hours. Lipid Profile No results for input(s): CHOL, HDL, LDLCALC, TRIG, CHOLHDL, LDLDIRECT in the last 72 hours. Thyroid function studies No results for input(s): TSH, T4TOTAL, T3FREE, THYROIDAB in the last 72 hours.  Invalid input(s): FREET3 Anemia work up No results for input(s): VITAMINB12, FOLATE,  FERRITIN, TIBC, IRON, RETICCTPCT in the last 72 hours. Urinalysis No results found for: COLORURINE, APPEARANCEUR, [place], [place], GLUCOSEU, [place], [place], [place], PROTEINUR, UROBILINOGEN, NITRITE, LEUKOCYTESUR Sepsis Labs Invalid input(s): PROCALCITONIN,  WBC,  LACTICIDVEN Microbiology Recent Results (from the past 240 hour(s))  Resp Panel by RT-PCR (Flu A&B, Covid) Nasopharyngeal Swab     Status: None   Collection Time: 05/20/21 10:51 PM   Specimen: Nasopharyngeal Swab; Nasopharyngeal(NP) swabs in vial transport medium  Result Value Ref Range Status   SARS Coronavirus 2 by RT PCR NEGATIVE NEGATIVE Final    Comment: (NOTE) SARS-CoV-2 target nucleic acids are NOT DETECTED.  The SARS-CoV-2 RNA is generally detectable in upper respiratory specimens during the acute phase of infection. The lowest concentration of SARS-CoV-2 viral copies this assay can detect is 138 copies/mL. A negative result does not preclude SARS-Cov-2 infection and should not be used as the sole basis for treatment or other patient management decisions. A negative result may occur with  improper specimen collection/handling, submission of specimen other than nasopharyngeal swab, presence of viral mutation(s) within the areas targeted by this assay, and inadequate number of viral copies(<138 copies/mL). A negative result must be combined with clinical observations, patient history, and epidemiological information. The expected result is Negative.  Fact Sheet for Patients:  EntrepreneurPulse.com.au  Fact Sheet for Healthcare Providers:  IncredibleEmployment.be  This test is no t yet approved or cleared by the [place] FDA and  has been authorized for detection and/or diagnosis of SARS-CoV-2 by FDA under an Emergency Use Authorization (EUA). This EUA will remain  in effect (meaning this test can be used) for the duration of the COVID-19 declaration under Section  564(b)(1) of the Act, 21 U.S.C.section 360bbb-3(b)(1), unless the authorization is terminated  or revoked sooner.       Influenza A by PCR NEGATIVE NEGATIVE Final   Influenza B by PCR NEGATIVE NEGATIVE Final    Comment: (NOTE) The Xpert Xpress SARS-CoV-2/FLU/RSV plus assay is intended as an aid in the diagnosis of influenza from Nasopharyngeal swab specimens and should not be used as a sole basis for treatment. Nasal washings and aspirates are unacceptable for Xpert Xpress SARS-CoV-2/FLU/RSV testing.  Fact Sheet for Patients: EntrepreneurPulse.com.au  Fact Sheet for Healthcare Providers: IncredibleEmployment.be  This test is not yet approved or cleared by the [place] FDA and has been authorized for detection and/or diagnosis of SARS-CoV-2 by FDA under an Emergency Use Authorization (EUA). This EUA will remain in effect (meaning this test can be used) for the duration of the COVID-19 declaration under Section 564(b)(1) of the Act, 21 U.S.C. section 360bbb-3(b)(1), unless the authorization is terminated or revoked.  Performed at [facility], [place] [address], [place], [place] [address]      Time coordinating discharge: Over 30 minutes  SIGNED:   Phillips Climes, MD  [facility] 05/25/2021, 1:37 PM Pager   If 7PM-7AM, please contact night-coverage [email or web address] Password [email or web address]

## 2021-05-25 NOTE — Progress Notes (Signed)
Daily Progress Note   Patient Name: Stephanie Garza       Date: 05/25/2021 DOB: 1941/02/02  Age: 80 y.o. MRN#: [id number] Attending Physician: No att. providers found Primary Care Physician: Zoila Shutter, NP Admit Date: 05/20/2021  Reason for Consultation/Follow-up: goals of care, symptom management  Subjective: Notified by Dr. Waldron Labs that he plans to discharge patient today.   Per review of MAR, in the past 24 hours patient has only needed 1 dose of PRN oxycodone IR for breakthrough pain.   13:00 - I went to bedside and discussed with patient that I will be providing her prescriptions for enough pain medication to get her through until she could be seen by outpatient palliative ([facility]). They plan to see her within 7 days, so explained I will write for a 10 day supple of medication.  Review of home mediation list reveals that oxycodone IR 15 mg was recently filled on 05/10/21 (quantity of 60).   I explained to patient that I cannot provide a new prescription for oxy IR 10 mg tablets when she already has a large quantity of oxy IR 15 mg tablets at home.  she will need to either: a) use her current stock of oxy IR pills, and cut them in half to equal 7.5 mg or  b) turn in her current stock of oxy IR pills to receive the new Rx for oxy IR 10 mg  After sending the new prescriptions, I was notified by patient's pharmacy that her insurance does not cover Oxycontin but they will cover Xtampza. They cancel the Rx for Oxycontin and I send a new Rx for an equivalent dose of Xtampza. Also discussed with pharmacist not to fill Rx for Oxycodone IR 10 mg unless patient turns in her current supply of 15 mg tablets.   As patient was being discharged, I returned to bedside to explain the change  from Oxycontin to [place]. Patient verbalizes understanding. Patient's daughter Kyra Searles is present. She is under the impression that her mother will be receiving hospice care at home. I explain that she will have outpatient palliative, not hospice. Provided education on the difference between outpatient palliative care and hospice.  Discussed that if/when patient is unable to continue cancer treatment, she would be eligible for hospice care at that time. Faith states that  they are going to pursue additional cancer treatment through [facility] She feels strongly that her mother's condition is "curable" and expresses interest in all full scope treatments offered.    Length of Stay: 4   Physical Exam Vitals reviewed.  Constitutional:      General: She is not in acute distress.    Comments: Chronically ill-appearing  Pulmonary:     Effort: Pulmonary effort is normal.  Neurological:     Mental Status: She is alert and oriented to person, place, and time.            Vital Signs: BP (!) 134/100 (BP Location: Right Arm)   Pulse 68   Temp 97.8 F (36.6 C) (Oral)   Resp 14   Ht 5\' 5"  (1.651 m)   Wt 71.7 kg   SpO2 100%   BMI 26.29 kg/m  SpO2: SpO2: 100 % O2 Device: O2 Device: Nasal Cannula O2 Flow Rate: O2 Flow Rate (L/min): 4 L/min        Palliative Assessment/Data: PPS 50-60%      Palliative Care Assessment & Plan   Patient Profile: 80 y.o. female  with past medical history of COPD on home oxygen 3 L, chronic diastolic CHF, and recent diagnosis of small cell lung cancer presented to the ED on 05/20/21 from home with complaints of worsening shortness of breath x2 days with productive cough. Patient was admitted on 05/20/2021 with acute on chronic respiratory failure with hypoxia secondary to COPD exacerbation, small cell lung cancer.    ED Course: In the ER patient was short of breath had to be placed on BiPAP.  Chest x-ray does not show anything acute.  High sensitive  troponins were negative.  BNP is 151.  EKG shows normal sinus rhythm.  COVID test was negative.  Labs show worsening of anemia with hemoglobin of 7.6 and macrocytic picture.  Stool for occult blood was negative.  Patient admitted for acute respiratory failure with hypoxia secondary to COPD.   Assessment: Acute on chronic respiratory failure with hypoxia Chronic diastolic heart failure Chronic pain syndrome Small cell lung carcinoma COPD exacerbation   Recommendations/Plan: Rx provided: Oxycodone ER (Xtampza) 27 mg every 12 hours (quantity-20) Oxycodone IR 10 mg every 6 hours as needed for breakthrough pain (quantity-30, do not fill unless patient turns in current supply of oxycodone 15 mg tablets) Stop taking oxycodone IR 15 mg at home Outpatient Palliative ([facility]) will provide ongoing management of patient's pain medications after discharge  Goals of Care and Additional Recommendations: Limitations on Scope of Treatment: Full Scope Treatment  Code Status: Full  Prognosis:  Unable to determine, less than 6 months would not be surprising in the setting of advanced cancer  Discharge Planning: Home with Palliative Services   Thank you for allowing the Palliative Medicine Team to assist in the care of this patient.   Total Time 67 minutes Prolonged Time Billed  yes       Greater than 50%  of this time was spent counseling and coordinating care related to the above assessment and plan.  Lavena Bullion, NP  Please contact Palliative Medicine Team phone at [phone] for questions and concerns.

## 2021-05-25 NOTE — Discharge Instructions (Addendum)
Follow with Primary MD Zoila Shutter, NP in 7 days   Get CBC, CMP, y checked  by Primary MD next visit.    Activity: As tolerated with Full fall precautions use walker/cane & assistance as needed   Disposition Home    Diet: Heart Healthy  , with feeding assistance and aspiration precautions.  For Heart failure patients - Check your Weight same time everyday, if you gain over 2 pounds, or you develop in leg swelling, experience more shortness of breath or chest pain, call your Primary MD immediately. Follow Cardiac Low Salt Diet and 1.5 lit/day fluid restriction.   On your next visit with your primary care physician please Get Medicines reviewed and adjusted.   Please request your Prim.MD to go over all Hospital Tests and Procedure/Radiological results at the follow up, please get all Hospital records sent to your Prim MD by signing hospital release before you go home.   If you experience worsening of your admission symptoms, develop shortness of breath, life threatening emergency, suicidal or homicidal thoughts you must seek medical attention immediately by calling 911 or calling your MD immediately  if symptoms less severe.  You Must read complete instructions/literature along with all the possible adverse reactions/side effects for all the Medicines you take and that have been prescribed to you. Take any new Medicines after you have completely understood and accpet all the possible adverse reactions/side effects.   Do not drive, operating heavy machinery, perform activities at heights, swimming or participation in water activities or provide baby sitting services if your were admitted for syncope or siezures until you have seen by Primary MD or a Neurologist and advised to do so again.  Do not drive when taking Pain medications.    Do not take more than prescribed Pain, Sleep and Anxiety Medications  Special Instructions: If you have smoked or chewed Tobacco  in the last 2 yrs  please stop smoking, stop any regular Alcohol  and or any Recreational drug use.  Wear Seat belts while driving.   Please note  You were cared for by a hospitalist during your hospital stay. If you have any questions about your discharge medications or the care you received while you were in the hospital after you are discharged, you can call the unit and asked to speak with the hospitalist on call if the hospitalist that took care of you is not available. Once you are discharged, your primary care physician will handle any further medical issues. Please note that NO REFILLS for any discharge medications will be authorized once you are discharged, as it is imperative that you return to your primary care physician (or establish a relationship with a primary care physician if you do not have one) for your aftercare needs so that they can reassess your need for medications and monitor your lab values.

## 2021-05-25 NOTE — Progress Notes (Signed)
OT Cancellation Note  Patient Details Name: Stephanie Garza MRN: [id number] DOB: [name] 18, 1942   Cancelled Treatment:    Reason Eval/Treat Not Completed: Fatigue/lethargy limiting ability to participate: Pt just finished up with physical therapy and now with new discharge order and to go home today per RN. With this knowledge, pt asked to skip OT today to allow her to rest for the travel home.  Will keep pt on service in case d/c is delayed.   Julien Girt 05/25/2021, 1:45 PM

## 2021-05-25 NOTE — Progress Notes (Signed)
Discharge teaching complete. Meds, diet, activity, follow up appointments reviewed and all questions answered. Copy of instructions given to patient and prescriptions sent to pharmacy.  

## 2021-05-25 NOTE — Progress Notes (Signed)
Physical Therapy Treatment Patient Details Name: Stephanie Garza MRN: [id number] DOB: November 16, 1940 Today's Date: 05/25/2021    History of Present Illness 80 y.o. female presents to [facility] on 05/21/2021 with worsening SOB. Pt admitted for acute on chronic respiratory failure with hypoxia. PMH includes COPD, CHF, small cell lung cancer.    PT Comments    Pt tolerated treatment well and was motivated to move.Pt increased ambulation distance compared to previous session. Upright posture limited by back pain. Educated on energy conservation when feeling SOB or wheezing. Continue to recommend HHPT as pt is moving well with increased time.   Vitals - HR 102, spO2 98% on 4L during ambulation   Follow Up Recommendations  Home health PT;Supervision - Intermittent     Equipment Recommendations  None recommended by PT    Recommendations for Other Services       Precautions / Restrictions Precautions Precautions: Fall Restrictions Weight Bearing Restrictions: No    Mobility  Bed Mobility               General bed mobility comments: EOB upon arrival    Transfers Overall transfer level: Needs assistance Equipment used: Rolling walker (2 wheeled) Transfers: Sit to/from Stand Sit to Stand: Supervision         General transfer comment: Sit to stand with supervision for safety. Demonstrated good recall of hand placement from previous session. Increased time.  Ambulation/Gait Ambulation/Gait assistance: Min guard Gait Distance (Feet): 236 Feet Assistive device: Rolling walker (2 wheeled) Gait Pattern/deviations: Step-through pattern;Trunk flexed Gait velocity: Decreased Gait velocity interpretation: <1.8 ft/sec, indicate of risk for recurrent falls General Gait Details: Demonstrated increased trunk flexion and pushing RW far from body, limited by back pain. Verbal cues to correct posture and keep RW close.   Stairs             Wheelchair Mobility    Modified  Rankin (Stroke Patients Only)       Balance           Standing balance support: Bilateral upper extremity supported;During functional activity Standing balance-Leahy Scale: Poor Standing balance comment: Requires BUE support                            Cognition Arousal/Alertness: Awake/alert Behavior During Therapy: WFL for tasks assessed/performed Overall Cognitive Status: Within Functional Limits for tasks assessed                                        Exercises      General Comments General comments (skin integrity, edema, etc.): VSS on 4L. Pt wheezing at end of session with sats 99%.      Pertinent Vitals/Pain Pain Assessment: Faces Faces Pain Scale: Hurts little more Pain Location: Back pain with ambulation Pain Descriptors / Indicators: Sore Pain Intervention(s): Monitored during session    Home Living                      Prior Function            PT Goals (current goals can now be found in the care plan section) Progress towards PT goals: Progressing toward goals    Frequency    Min 3X/week      PT Plan Current plan remains appropriate    Co-evaluation  AM-PAC PT "6 Clicks" Mobility   Outcome Measure  Help needed turning from your back to your side while in a flat bed without using bedrails?: A Little Help needed moving from lying on your back to sitting on the side of a flat bed without using bedrails?: A Little Help needed moving to and from a bed to a chair (including a wheelchair)?: A Little Help needed standing up from a chair using your arms (e.g., wheelchair or bedside chair)?: A Little Help needed to walk in hospital room?: A Little Help needed climbing 3-5 steps with a railing? : A Lot 6 Click Score: 17    End of Session Equipment Utilized During Treatment: Oxygen Activity Tolerance: Patient tolerated treatment well Patient left: with call bell/phone within reach;in  bed Nurse Communication: Mobility status PT Visit Diagnosis: Other abnormalities of gait and mobility (R26.89)     Time: 3744-5146 PT Time Calculation (min) (ACUTE ONLY): 17 min  Charges:  $Gait Training: 8-22 mins                     Louie Casa, SPT Acute Rehab: [phone]     Domingo Dimes 05/25/2021, 1:38 PM

## 2021-05-25 NOTE — Discharge Summary (Signed)
Physician Discharge Summary  Stephanie Garza IWO:[id number] DOB: 11-19-1940 DOA: 05/20/2021  PCP: Zoila Shutter, NP  Admit date: 05/20/2021 Discharge date: 05/25/2021  Admitted From: Home Disposition:  Home   Recommendations for Outpatient Follow-up:  Follow up with PCP in 1 weeks Please obtain BMP/CBC in one week Patient to continue with B12 supplement, recheck level in 6 to 8 weeks, level significantly low at 76 Encourage patient to increase her nutritional supplements as well.   Home Health:YES Equipment/Devices: She already has home oxygen, rolling walker  Discharge Condition:Stable CODE STATUS:FUL Diet recommendation: Heart Healthy  Brief/Interim Summary:    Acute on chronic respiratory failure with hypoxia  -Required BiPAP initially due to COPD exacerbation and increased work of breathing, she was treated with IV steroids, and azithromycin as well, respiratory status gradually improved, she weaned off BiPAP totally, no requirement of BiPAP over last 2 days, dyspnea has significantly improved, no wheezing today, she is back on her baseline 3 L oxygen via nasal cannula, she will be discharged on prednisone taper . -He was encouraged to keep using incentive spirometer and flutter valve.     History of chronic diastolic CHF  - .Patient appears to be euvolemic currently, no obvious evidence of volume overload  -Continue with home medications .   Small cell lung cancer  -She is being followed by Dr. Bobby Rumpf oncology at [facility] .  As well she was receiving radiation oncology, given her very poor functional status apparently chemo and radiation has been held. -I have discussed with radiation oncology at [facility] upon patient/family request to reassess if patient can do on her deviation therapy sessions, she did finish 17 out of 29 treatment sessions, but she had frequent interruptions of her therapy for which patient therapy has been stopped, and our local  radiation oncologist would initiate/resume any patient's therapy here, and they recommend she follows with her primary radiation oncologist as well she is having referral to follow with [facility] as well.  Chronic pain syndrome -Palliative medicine input greatly appreciated, her medicine has been adjusted, and she is connected to palliative on discharge.(She is not hospice.)  Macrocytic anemia -B12 significantly low at 76, will replete. -she is Hemoccult negative . -Hemoglobin 7.1 on 8/27, she was transfused 1 unit PRBC, good response hemoglobin is 8.6 today.   B12 deficiency -She received IM supplements during hospital stay, she will be discharged on oral supplement, monitor closely as an outpatient     Deconditioning /failure to thrive -PT/OT arranged as an outpatient.   Anxiety -Continue with home regimen  Severe protein calorie malnutrition -Nutritionist were consulted  Discharge Diagnoses:  Principal Problem:   Acute on chronic respiratory failure with hypoxia (HCC) Active Problems:   Chronic diastolic heart failure (HCC)   Chronic pain syndrome   Small cell carcinoma of lung, right (HCC)   COPD exacerbation (HCC)   Acute respiratory failure with hypoxia (HCC)   Protein-calorie malnutrition, severe    Discharge Instructions  Discharge Instructions     Diet - low sodium heart healthy   Complete by: As directed    Discharge instructions   Complete by: As directed    Follow with Primary MD Zoila Shutter, NP in 7 days   Get CBC, CMP, y checked  by Primary MD next visit.    Activity: As tolerated with Full fall precautions use walker/cane & assistance as needed   Disposition Home    Diet: Heart Healthy  , with  feeding assistance and aspiration precautions.  For Heart failure patients - Check your Weight same time everyday, if you gain over 2 pounds, or you develop in leg swelling, experience more shortness of breath or chest pain, call your  Primary MD immediately. Follow Cardiac Low Salt Diet and 1.5 lit/day fluid restriction.   On your next visit with your primary care physician please Get Medicines reviewed and adjusted.   Please request your Prim.MD to go over all Hospital Tests and Procedure/Radiological results at the follow up, please get all Hospital records sent to your Prim MD by signing hospital release before you go home.   If you experience worsening of your admission symptoms, develop shortness of breath, life threatening emergency, suicidal or homicidal thoughts you must seek medical attention immediately by calling 911 or calling your MD immediately  if symptoms less severe.  You Must read complete instructions/literature along with all the possible adverse reactions/side effects for all the Medicines you take and that have been prescribed to you. Take any new Medicines after you have completely understood and accpet all the possible adverse reactions/side effects.   Do not drive, operating heavy machinery, perform activities at heights, swimming or participation in water activities or provide baby sitting services if your were admitted for syncope or siezures until you have seen by Primary MD or a Neurologist and advised to do so again.  Do not drive when taking Pain medications.    Do not take more than prescribed Pain, Sleep and Anxiety Medications  Special Instructions: If you have smoked or chewed Tobacco  in the last 2 yrs please stop smoking, stop any regular Alcohol  and or any Recreational drug use.  Wear Seat belts while driving.   Please note  You were cared for by a hospitalist during your hospital stay. If you have any questions about your discharge medications or the care you received while you were in the hospital after you are discharged, you can call the unit and asked to speak with the hospitalist on call if the hospitalist that took care of you is not available. Once you are discharged, your  primary care physician will handle any further medical issues. Please note that NO REFILLS for any discharge medications will be authorized once you are discharged, as it is imperative that you return to your primary care physician (or establish a relationship with a primary care physician if you do not have one) for your aftercare needs so that they can reassess your need for medications and monitor your lab values.   Increase activity slowly   Complete by: As directed       Allergies as of 05/25/2021       Reactions   Cefaclor Rash        Medication List     STOP taking these medications    nitrofurantoin (macrocrystal-monohydrate) 100 MG capsule Commonly known as: Macrobid       TAKE these medications    acetaminophen 325 MG tablet Commonly known as: TYLENOL Take 2 tablets (650 mg total) by mouth every 6 (six) hours as needed for mild pain (or Fever >/= 101).   albuterol 108 (90 Base) MCG/ACT inhaler Commonly known as: VENTOLIN HFA Inhale 2 puffs into the lungs every 4 (four) hours as needed for wheezing or shortness of breath.   aspirin 81 MG EC tablet Take 81 mg by mouth daily.   budesonide-formoterol 160-4.5 MCG/ACT inhaler Commonly known as: SYMBICORT Inhale 2 puffs into the lungs 2 (  two) times daily.   buPROPion 300 MG 24 hr tablet Commonly known as: WELLBUTRIN XL Take 300 mg by mouth daily.   dicyclomine 20 MG tablet Commonly known as: BENTYL Take 20 mg by mouth 2 (two) times daily as needed for spasms.   diphenoxylate-atropine 2.5-0.025 MG tablet Commonly known as: LOMOTIL Take 2 tablets by mouth 4 (four) times daily as needed for diarrhea or loose stools (1-2 TABLETS AFTER EACH LOOSE STOOL - MAXIMUM OF 10 DAILY).   famotidine 20 MG tablet Commonly known as: PEPCID Take 20 mg by mouth daily.   feeding supplement Liqd Take 237 mLs by mouth 3 (three) times daily between meals.   fluticasone 50 MCG/ACT nasal spray Commonly known as: FLONASE Place 2  sprays into both nostrils daily as needed for allergies.   furosemide 20 MG tablet Commonly known as: LASIX Take 20 mg by mouth daily.   gabapentin 800 MG tablet Commonly known as: NEURONTIN Take 800 mg by mouth 3 (three) times daily.   ipratropium-albuterol 0.5-2.5 (3) MG/3ML Soln Commonly known as: DUONEB Take 3 mLs by nebulization every 4 (four) hours as needed (shortness of breath).   LORazepam 1 MG tablet Commonly known as: ATIVAN Take 1.5 tablets (1.5 mg total) by mouth every 8 (eight) hours as needed for up to 10 days for anxiety.   multivitamin with minerals Tabs tablet Take 1 tablet by mouth daily. Start taking on: May 26, 2021   omeprazole 40 MG capsule Commonly known as: PRILOSEC Take 40 mg by mouth daily.   ondansetron 8 MG tablet Commonly known as: ZOFRAN Take 8 mg by mouth every 8 (eight) hours as needed for nausea.   Oxycodone HCl 10 MG Tabs Take 1 tablet (10 mg total) by mouth every 6 (six) hours as needed for breakthrough pain. What changed:  medication strength how much to take reasons to take this   OXYGEN Inhale 3 L into the lungs continuous.   potassium chloride SA 20 MEQ tablet Commonly known as: KLOR-CON Take 20 mEq by mouth daily.   predniSONE 10 MG (21) Tbpk tablet Commonly known as: STERAPRED UNI-PAK 21 TAB Use per package instruction   primidone 50 MG tablet Commonly known as: MYSOLINE Take 50 mg by mouth at bedtime.   prochlorperazine 10 MG tablet Commonly known as: COMPAZINE Take 1 tablet (10 mg total) by mouth every 6 (six) hours as needed for nausea or vomiting.   sertraline 25 MG tablet Commonly known as: ZOLOFT Take 25 mg by mouth daily.   Spiriva HandiHaler 18 MCG inhalation capsule Generic drug: tiotropium Place 18 mcg into inhaler and inhale daily.   spironolactone 25 MG tablet Commonly known as: ALDACTONE Take 25 mg by mouth daily.   tamsulosin 0.4 MG Caps capsule Commonly known as: FLOMAX Take 0.4 mg by  mouth daily.   Trelegy Ellipta 100-62.5-25 MCG/INH Aepb Generic drug: Fluticasone-Umeclidin-Vilant Inhale 1 puff into the lungs daily.   vitamin B-12 500 MCG tablet Commonly known as: CYANOCOBALAMIN Take 1 tablet (500 mcg total) by mouth daily.   Vitamin D (Ergocalciferol) 1.25 MG (50000 UNIT) Caps capsule Commonly known as: DRISDOL Take 50,000 Units by mouth every 7 (seven) days.   Xtampza ER 27 MG C12a Generic drug: oxyCODONE ER Take 27 mg by mouth every 12 (twelve) hours.               Durable Medical Equipment  (From admission, onward)           Start  Ordered   05/25/21 1336  For home use only DME Walker rolling  Once       Question Answer Comment  Walker: With [place]   Patient needs a walker to treat with the following condition Weakness      05/25/21 1335            Follow-up Information     Zoila Shutter, NP. Go on 06/01/2021.   Why: Please attend your hospital discharge appointment with Irven Shelling NP on Tuesday, 06/01/21, at 2:00pm. Contact information: [place] [phone]         [facility] Follow up.   Specialty: Home Health Services Why: Alvis Lemmings will contact you to schedule your first home visit. Contact information: [place] [address] [phone]         [facility] (outpatient palliative care) Follow up.   Why: [facility] will contact you to schedule your first home visit Contact information: Home. Follow up.   Why: [facility] will continue to provide home oxygen.  Please call them with any problems. Contact information: [place] [address] [phone]                Allergies  Allergen Reactions   Cefaclor Rash    Consultations: Palliative medicine Discussed with radiation oncology by phone  Procedures/Studies: DG Chest Port 1  View  Result Date: 05/20/2021 CLINICAL DATA:  Shortness of breath EXAM: PORTABLE CHEST 1 VIEW COMPARISON:  05/11/2021 FINDINGS: Right Port-A-Cath remains in place, unchanged. Scarring at the left base. Right lung clear. Heart is normal size. No effusions or acute bony abnormality. IMPRESSION: Left basilar scarring. No active disease. Electronically Signed   By: Rolm Baptise M.D.   On: 05/20/2021 22:53      Subjective:  Reports she is feeling better today, dyspnea has improved, report her appetite has improved as well, she is eager to go home today. Discharge Exam: Vitals:   05/25/21 0411 05/25/21 0800  BP: (!) 121/57 (!) 134/100  Pulse:  68  Resp:  14  Temp: 97.8 F (36.6 C)   SpO2:  100%   Vitals:   05/24/21 2200 05/24/21 2357 05/25/21 0411 05/25/21 0800  BP:   (!) 121/57 (!) 134/100  Pulse: 76   68  Resp: (!) 22   14  Temp:  97.6 F (36.4 C) 97.8 F (36.6 C)   TempSrc:  Oral Oral   SpO2: 98%   100%  Weight:      Height:        General: Pt is alert, awake, not in acute distress, overall frail, but she appears to be in good spirits today, comfortable, no apparent distress. Cardiovascular: RRR, S1/S2 +, no rubs, no gallops Respiratory: CTA bilaterally, no wheezing, no rhonchi Abdominal: Soft, NT, ND, bowel sounds + Extremities: no edema, no cyanosis    The results of significant diagnostics from this hospitalization (including imaging, microbiology, ancillary and laboratory) are listed below for reference.     Microbiology: Recent Results (from the past 240 hour(s))  Resp Panel by RT-PCR (Flu A&B, Covid) Nasopharyngeal Swab     Status: None   Collection Time: 05/20/21 10:51 PM   Specimen: Nasopharyngeal Swab; Nasopharyngeal(NP) swabs in vial transport medium  Result Value Ref Range Status   SARS Coronavirus 2 by RT PCR NEGATIVE NEGATIVE Final  Comment: (NOTE) SARS-CoV-2 target nucleic acids are NOT DETECTED.  The SARS-CoV-2 RNA is generally detectable in upper  respiratory specimens during the acute phase of infection. The lowest concentration of SARS-CoV-2 viral copies this assay can detect is 138 copies/mL. A negative result does not preclude SARS-Cov-2 infection and should not be used as the sole basis for treatment or other patient management decisions. A negative result may occur with  improper specimen collection/handling, submission of specimen other than nasopharyngeal swab, presence of viral mutation(s) within the areas targeted by this assay, and inadequate number of viral copies(<138 copies/mL). A negative result must be combined with clinical observations, patient history, and epidemiological information. The expected result is Negative.  Fact Sheet for Patients:  EntrepreneurPulse.com.au  Fact Sheet for Healthcare Providers:  IncredibleEmployment.be  This test is no t yet approved or cleared by the [place] FDA and  has been authorized for detection and/or diagnosis of SARS-CoV-2 by FDA under an Emergency Use Authorization (EUA). This EUA will remain  in effect (meaning this test can be used) for the duration of the COVID-19 declaration under Section 564(b)(1) of the Act, 21 U.S.C.section 360bbb-3(b)(1), unless the authorization is terminated  or revoked sooner.       Influenza A by PCR NEGATIVE NEGATIVE Final   Influenza B by PCR NEGATIVE NEGATIVE Final    Comment: (NOTE) The Xpert Xpress SARS-CoV-2/FLU/RSV plus assay is intended as an aid in the diagnosis of influenza from Nasopharyngeal swab specimens and should not be used as a sole basis for treatment. Nasal washings and aspirates are unacceptable for Xpert Xpress SARS-CoV-2/FLU/RSV testing.  Fact Sheet for Patients: EntrepreneurPulse.com.au  Fact Sheet for Healthcare Providers: IncredibleEmployment.be  This test is not yet approved or cleared by the [place] FDA and has been  authorized for detection and/or diagnosis of SARS-CoV-2 by FDA under an Emergency Use Authorization (EUA). This EUA will remain in effect (meaning this test can be used) for the duration of the COVID-19 declaration under Section 564(b)(1) of the Act, 21 U.S.C. section 360bbb-3(b)(1), unless the authorization is terminated or revoked.  Performed at [facility], [place] [address], [place], [place] [address]      Labs: BNP (last 3 results) Recent Labs    05/20/21 2251  BNP 151.6*    Basic Metabolic Panel: Recent Labs  Lab 05/21/21 0254 05/22/21 0234 05/23/21 0336 05/24/21 0307 05/25/21 0225  NA 138 137 135 138 138  K 4.5 4.8 4.5 3.9 4.3  CL 102 103 98 99 97*  CO2 27 29 29  32 31  GLUCOSE 133* 119* 132* 87 155*  BUN 17 21 22  25* 25*  CREATININE 0.90 1.06* 1.01* 1.12* 0.90  CALCIUM 8.8* 8.9 8.9 8.5* 9.0    Liver Function Tests: No results for input(s): AST, ALT, ALKPHOS, BILITOT, PROT, ALBUMIN in the last 168 hours. No results for input(s): LIPASE, AMYLASE in the last 168 hours. No results for input(s): AMMONIA in the last 168 hours. CBC: Recent Labs  Lab 05/20/21 2251 05/20/21 2328 05/21/21 0254 05/22/21 0234 05/22/21 2050 05/23/21 0336 05/24/21 0307 05/25/21 0225  WBC 5.3  --  4.8 3.4*  --  6.8 5.8 4.5  NEUTROABS 3.8  --   --   --   --   --   --   --   HGB 7.6*   < > 7.7* 7.1* 9.3* 8.6* 8.4* 9.0*  HCT 24.9*   < > 25.7* 22.3* 28.5* 26.5* 27.1* 28.3*  MCV 102.0*  --  103.2* 97.4  --  95.3 96.4 95.3  PLT 179  --  144* 138*  --  127* 131* 138*   < > = values in this interval not displayed.    Cardiac Enzymes: No results for input(s): CKTOTAL, CKMB, CKMBINDEX, TROPONINI in the last 168 hours. BNP: Invalid input(s): POCBNP CBG: No results for input(s): GLUCAP in the last 168 hours. D-Dimer No results for input(s): DDIMER in the last 72 hours. Hgb A1c No results for input(s): HGBA1C in the last 72 hours. Lipid Profile No results for input(s): CHOL,  HDL, LDLCALC, TRIG, CHOLHDL, LDLDIRECT in the last 72 hours. Thyroid function studies No results for input(s): TSH, T4TOTAL, T3FREE, THYROIDAB in the last 72 hours.  Invalid input(s): FREET3 Anemia work up No results for input(s): VITAMINB12, FOLATE, FERRITIN, TIBC, IRON, RETICCTPCT in the last 72 hours. Urinalysis No results found for: COLORURINE, APPEARANCEUR, [place], [place], GLUCOSEU, [place], [place], [place], PROTEINUR, UROBILINOGEN, NITRITE, LEUKOCYTESUR Sepsis Labs Invalid input(s): PROCALCITONIN,  WBC,  LACTICIDVEN Microbiology Recent Results (from the past 240 hour(s))  Resp Panel by RT-PCR (Flu A&B, Covid) Nasopharyngeal Swab     Status: None   Collection Time: 05/20/21 10:51 PM   Specimen: Nasopharyngeal Swab; Nasopharyngeal(NP) swabs in vial transport medium  Result Value Ref Range Status   SARS Coronavirus 2 by RT PCR NEGATIVE NEGATIVE Final    Comment: (NOTE) SARS-CoV-2 target nucleic acids are NOT DETECTED.  The SARS-CoV-2 RNA is generally detectable in upper respiratory specimens during the acute phase of infection. The lowest concentration of SARS-CoV-2 viral copies this assay can detect is 138 copies/mL. A negative result does not preclude SARS-Cov-2 infection and should not be used as the sole basis for treatment or other patient management decisions. A negative result may occur with  improper specimen collection/handling, submission of specimen other than nasopharyngeal swab, presence of viral mutation(s) within the areas targeted by this assay, and inadequate number of viral copies(<138 copies/mL). A negative result must be combined with clinical observations, patient history, and epidemiological information. The expected result is Negative.  Fact Sheet for Patients:  EntrepreneurPulse.com.au  Fact Sheet for Healthcare Providers:  IncredibleEmployment.be  This test is no t yet approved or cleared by the [place] FDA and  has been authorized for detection and/or diagnosis of SARS-CoV-2 by FDA under an Emergency Use Authorization (EUA). This EUA will remain  in effect (meaning this test can be used) for the duration of the COVID-19 declaration under Section 564(b)(1) of the Act, 21 U.S.C.section 360bbb-3(b)(1), unless the authorization is terminated  or revoked sooner.       Influenza A by PCR NEGATIVE NEGATIVE Final   Influenza B by PCR NEGATIVE NEGATIVE Final    Comment: (NOTE) The Xpert Xpress SARS-CoV-2/FLU/RSV plus assay is intended as an aid in the diagnosis of influenza from Nasopharyngeal swab specimens and should not be used as a sole basis for treatment. Nasal washings and aspirates are unacceptable for Xpert Xpress SARS-CoV-2/FLU/RSV testing.  Fact Sheet for Patients: EntrepreneurPulse.com.au  Fact Sheet for Healthcare Providers: IncredibleEmployment.be  This test is not yet approved or cleared by the [place] FDA and has been authorized for detection and/or diagnosis of SARS-CoV-2 by FDA under an Emergency Use Authorization (EUA). This EUA will remain in effect (meaning this test can be used) for the duration of the COVID-19 declaration under Section 564(b)(1) of the Act, 21 U.S.C. section 360bbb-3(b)(1), unless the authorization is terminated or revoked.  Performed at [facility], [place] [address],  [place], [place] [address]      Time coordinating discharge: Over 30 minutes  SIGNED:   Phillips Climes, MD  [facility] 05/25/2021, 6:16 PM Pager   If 7PM-7AM, please contact night-coverage [email or web address] Password [email or web address]

## 2021-05-25 NOTE — TOC Progression Note (Addendum)
Transition of Care [facility]) - Progression Note    Patient Details  Name: Stephanie Garza MRN: [id number] Date of Birth: 1941-05-13  Transition of Care [facility]) CM/SW Contact  Joanne Chars, LCSW Phone Number: 05/25/2021, 12:53 PM  Clinical Narrative:  CSW spoke with [name] at [facility].  Pt has past due balance and is in asset recovery.  They are unable to provide O2 or other DME for pt at this time.  CSW made two attempts to reach [facility], no response.  CSW spoke with [name] who agrees to [facility] referral to [facility].  Tommi Rumps accepts.  CSW spoke with [facility] patient who will deliver tank of O2 to the room.  Per [facility], pt currently has wheelchair and would not be eligible for rollator.  CSW spoke with pt and updated her on all of the above.  She said she will look for rollator on [employer].    Expected Discharge Plan: Home/Self Care Barriers to Discharge: Continued Medical Work up  Expected Discharge Plan and Services Expected Discharge Plan: Home/Self Care                                               Social Determinants of Health (SDOH) Interventions    Readmission Risk Interventions No flowsheet data found.

## 2021-05-25 NOTE — Care Management Important Message (Signed)
Important Message  Patient Details  Name: Stephanie Garza MRN: [id number] Date of Birth: 10-24-1940   Medicare Important Message Given:  Yes     Lujain Kraszewski [address] 05/25/2021, 4:07 PM

## 2021-06-11 ENCOUNTER — Encounter: Payer: Self-pay | Admitting: Oncology

## 2021-06-14 ENCOUNTER — Encounter: Payer: Self-pay | Admitting: Oncology

## 2021-06-15 ENCOUNTER — Telehealth: Payer: Self-pay

## 2021-06-15 ENCOUNTER — Telehealth: Payer: Self-pay | Admitting: Hematology and Oncology

## 2021-06-15 NOTE — Telephone Encounter (Signed)
RE: CT scan results Received: Today Mosher, Thalia Bloodgood, PA-C  Katelen Luepke W, RN I spoke with Faith regarding the results.          Pt's daughter, Kyra Searles, called to ask for the results of the scan yesterday. She states, "they are scheduling her for infusion. I just want to know if it  has spread, where it is". Pt saw Dr Tami Lin @ [facility].  I notified Dr Bobby Rumpf of above. He is willing to see Mrs. Slomski, if her care is going to be done here. But if treatment is going to be @ another location, then the provider @ said location should be discussing the scan results. [facility], is willing to call pt's dtr.

## 2021-06-15 NOTE — Telephone Encounter (Signed)
Spoke with daughter regarding CT chest results from 06/14/21. This revealed an interval decrease in size of a paramedian mass of the right upper lobe and a decrease in the size of the matted appearing pretracheal lymph nodes. There were new and enlarged right-sided pleural soft tissue nodules. There was a new hypodense lesion of the central liver dome consistent with new hepatic metastatic disease. She states her mom has seen Dr. Humphrey Rolls and is being scheduled for a PET scan prior to starting combined chemo/immunotherapy in [facility].

## 2021-06-15 NOTE — Telephone Encounter (Deleted)
Pt's daughter, Kyra Searles, called to ask for the results of the scan yesterday. She states, "they are scheduling her for infusion @ [facility]. I just want to know if it  has spread, where it is".  I notified Dr Bobby Rumpf of above. He is willing to see Mrs. Mohammad, if her care is going to be done here. But if treatment is going to be @ another location, then the provider @ said location should be discussing the scan results.

## 2021-08-26 DEATH — deceased

## 2022-05-29 IMAGING — DX DG CHEST 1V PORT
1 series · 1 of 1 positions shown · non-contrast
Comparison: 05/11/2021

CLINICAL DATA: Shortness of breath

EXAM:
PORTABLE CHEST 1 VIEW

[chest ap]
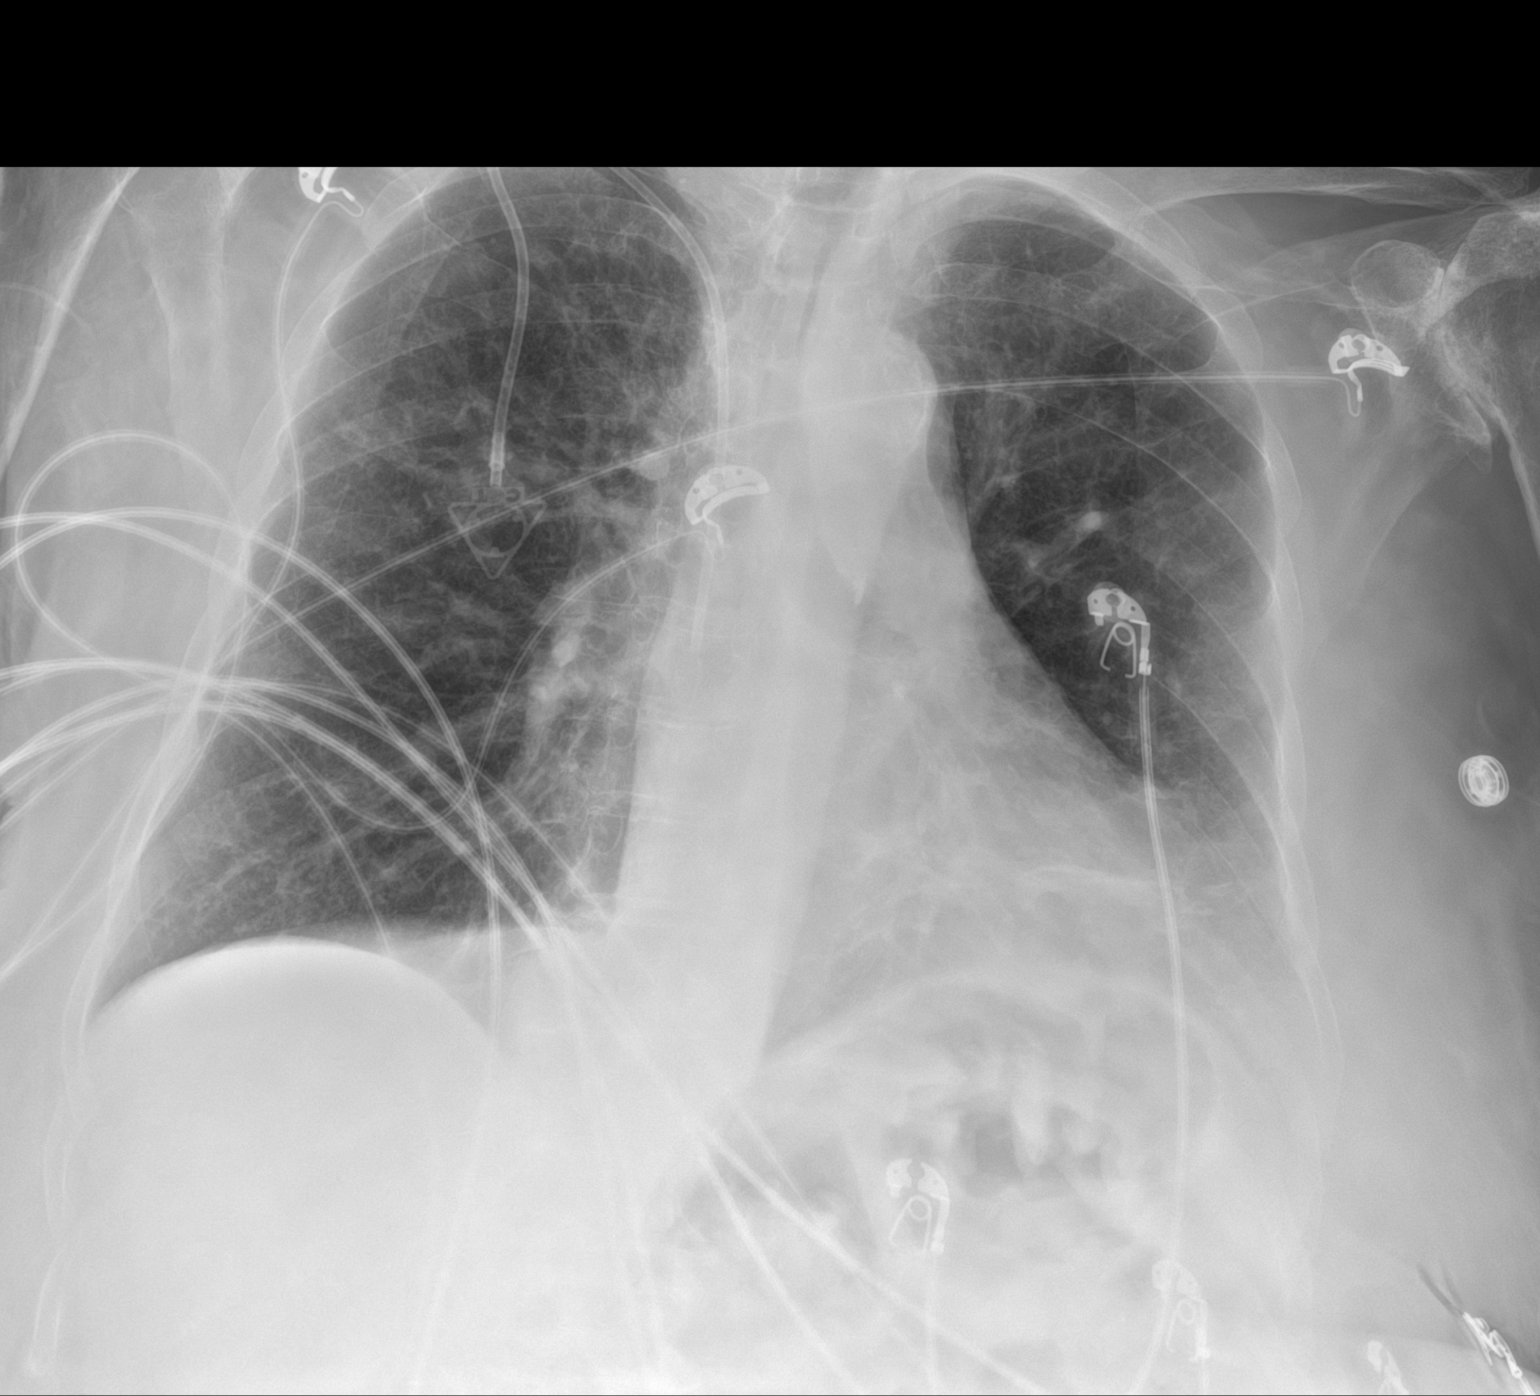

[1 of 1 positions shown; findings below may reference images not displayed]

FINDINGS: Right Port-A-Cath remains in place, unchanged. Scarring at the left
base. Right lung clear. Heart is normal size. No effusions or acute
bony abnormality.
IMPRESSION: Left basilar scarring.

No active disease.
